# Patient Record
Sex: Female | Born: 1950
Health system: Southern US, Community
[De-identification: ages and names within clinical notes are randomized; demographics above are authoritative.]

## PROBLEM LIST (undated history)

## (undated) DIAGNOSIS — Z860100 Personal history of colon polyps, unspecified: Secondary | ICD-10-CM

## (undated) DIAGNOSIS — IMO0002 Reserved for concepts with insufficient information to code with codable children: Secondary | ICD-10-CM

## (undated) DIAGNOSIS — M810 Age-related osteoporosis without current pathological fracture: Secondary | ICD-10-CM

## (undated) DIAGNOSIS — E039 Hypothyroidism, unspecified: Secondary | ICD-10-CM

## (undated) DIAGNOSIS — E559 Vitamin D deficiency, unspecified: Secondary | ICD-10-CM

## (undated) DIAGNOSIS — F419 Anxiety disorder, unspecified: Secondary | ICD-10-CM

## (undated) DIAGNOSIS — S329XXA Fracture of unspecified parts of lumbosacral spine and pelvis, initial encounter for closed fracture: Secondary | ICD-10-CM

## (undated) DIAGNOSIS — K259 Gastric ulcer, unspecified as acute or chronic, without hemorrhage or perforation: Secondary | ICD-10-CM

## (undated) DIAGNOSIS — F410 Panic disorder [episodic paroxysmal anxiety] without agoraphobia: Secondary | ICD-10-CM

## (undated) DIAGNOSIS — J45909 Unspecified asthma, uncomplicated: Secondary | ICD-10-CM

## (undated) HISTORY — DX: Vitamin D deficiency, unspecified: E55.9

## (undated) HISTORY — DX: Personal history of colonic polyps: Z86.010

## (undated) HISTORY — DX: Reserved for concepts with insufficient information to code with codable children: IMO0002

## (undated) HISTORY — DX: Fracture of unspecified parts of lumbosacral spine and pelvis, initial encounter for closed fracture: S32.9XXA

## (undated) HISTORY — DX: Unspecified asthma, uncomplicated: J45.909

## (undated) HISTORY — PX: BILATERAL SALPINGOOPHORECTOMY: SHX1223

## (undated) HISTORY — DX: Anxiety disorder, unspecified: F41.9

## (undated) HISTORY — DX: Age-related osteoporosis without current pathological fracture: M81.0

## (undated) HISTORY — DX: Panic disorder (episodic paroxysmal anxiety): F41.0

## (undated) HISTORY — PX: TOTAL ABDOMINAL HYSTERECTOMY: SHX209

## (undated) HISTORY — DX: Gastric ulcer, unspecified as acute or chronic, without hemorrhage or perforation: K25.9

## (undated) HISTORY — DX: Personal history of colon polyps, unspecified: Z86.0100

## (undated) HISTORY — PX: TONSILLECTOMY: SUR1361

## (undated) HISTORY — DX: Hypothyroidism, unspecified: E03.9

## (undated) HISTORY — PX: COLONOSCOPY: SHX174

---

## 1998-07-29 ENCOUNTER — Other Ambulatory Visit: Admission: RE | Admit: 1998-07-29 | Discharge: 1998-07-29 | Payer: Self-pay | Admitting: *Deleted

## 1999-09-29 ENCOUNTER — Other Ambulatory Visit: Admission: RE | Admit: 1999-09-29 | Discharge: 1999-09-29 | Payer: Self-pay | Admitting: *Deleted

## 1999-12-05 ENCOUNTER — Encounter: Admission: RE | Admit: 1999-12-05 | Discharge: 1999-12-05 | Payer: Self-pay | Admitting: Orthopaedic Surgery

## 1999-12-05 ENCOUNTER — Encounter: Payer: Self-pay | Admitting: Orthopaedic Surgery

## 2002-12-25 ENCOUNTER — Other Ambulatory Visit: Admission: RE | Admit: 2002-12-25 | Discharge: 2002-12-25 | Payer: Self-pay | Admitting: *Deleted

## 2005-12-20 ENCOUNTER — Other Ambulatory Visit: Admission: RE | Admit: 2005-12-20 | Discharge: 2005-12-20 | Payer: Self-pay | Admitting: Obstetrics and Gynecology

## 2007-12-04 ENCOUNTER — Encounter: Admission: RE | Admit: 2007-12-04 | Discharge: 2007-12-04 | Payer: Self-pay | Admitting: Emergency Medicine

## 2007-12-05 ENCOUNTER — Encounter: Admission: RE | Admit: 2007-12-05 | Discharge: 2007-12-05 | Payer: Self-pay | Admitting: Emergency Medicine

## 2008-01-03 ENCOUNTER — Encounter: Admission: RE | Admit: 2008-01-03 | Discharge: 2008-01-03 | Payer: Self-pay | Admitting: Family Medicine

## 2008-03-30 ENCOUNTER — Ambulatory Visit: Payer: Self-pay | Admitting: Pulmonary Disease

## 2008-03-30 DIAGNOSIS — E559 Vitamin D deficiency, unspecified: Secondary | ICD-10-CM | POA: Insufficient documentation

## 2008-03-30 DIAGNOSIS — R059 Cough, unspecified: Secondary | ICD-10-CM | POA: Insufficient documentation

## 2008-03-30 DIAGNOSIS — E039 Hypothyroidism, unspecified: Secondary | ICD-10-CM | POA: Insufficient documentation

## 2008-03-30 DIAGNOSIS — R05 Cough: Secondary | ICD-10-CM

## 2008-03-30 DIAGNOSIS — J309 Allergic rhinitis, unspecified: Secondary | ICD-10-CM | POA: Insufficient documentation

## 2008-03-30 DIAGNOSIS — F410 Panic disorder [episodic paroxysmal anxiety] without agoraphobia: Secondary | ICD-10-CM | POA: Insufficient documentation

## 2008-03-30 DIAGNOSIS — M810 Age-related osteoporosis without current pathological fracture: Secondary | ICD-10-CM | POA: Insufficient documentation

## 2008-03-31 ENCOUNTER — Ambulatory Visit: Payer: Self-pay | Admitting: Internal Medicine

## 2009-06-23 ENCOUNTER — Other Ambulatory Visit: Admission: RE | Admit: 2009-06-23 | Discharge: 2009-06-23 | Payer: Self-pay | Admitting: Obstetrics and Gynecology

## 2009-09-30 ENCOUNTER — Ambulatory Visit (HOSPITAL_COMMUNITY): Admission: RE | Admit: 2009-09-30 | Discharge: 2009-09-30 | Payer: Self-pay | Admitting: Internal Medicine

## 2010-05-02 ENCOUNTER — Encounter: Payer: Self-pay | Admitting: Family Medicine

## 2013-07-22 ENCOUNTER — Other Ambulatory Visit: Payer: Self-pay | Admitting: Gastroenterology

## 2013-11-22 ENCOUNTER — Encounter: Payer: Self-pay | Admitting: *Deleted

## 2015-09-29 DIAGNOSIS — E039 Hypothyroidism, unspecified: Secondary | ICD-10-CM | POA: Diagnosis not present

## 2015-11-10 DIAGNOSIS — J4520 Mild intermittent asthma, uncomplicated: Secondary | ICD-10-CM | POA: Diagnosis not present

## 2015-11-10 DIAGNOSIS — G4700 Insomnia, unspecified: Secondary | ICD-10-CM | POA: Diagnosis not present

## 2015-11-10 DIAGNOSIS — F419 Anxiety disorder, unspecified: Secondary | ICD-10-CM | POA: Diagnosis not present

## 2015-11-29 DIAGNOSIS — M810 Age-related osteoporosis without current pathological fracture: Secondary | ICD-10-CM | POA: Diagnosis not present

## 2015-12-08 DIAGNOSIS — H5712 Ocular pain, left eye: Secondary | ICD-10-CM | POA: Diagnosis not present

## 2015-12-08 DIAGNOSIS — H25013 Cortical age-related cataract, bilateral: Secondary | ICD-10-CM | POA: Diagnosis not present

## 2015-12-08 DIAGNOSIS — H5203 Hypermetropia, bilateral: Secondary | ICD-10-CM | POA: Diagnosis not present

## 2015-12-08 DIAGNOSIS — H04123 Dry eye syndrome of bilateral lacrimal glands: Secondary | ICD-10-CM | POA: Diagnosis not present

## 2016-01-07 DIAGNOSIS — Z23 Encounter for immunization: Secondary | ICD-10-CM | POA: Diagnosis not present

## 2016-02-10 DIAGNOSIS — H16103 Unspecified superficial keratitis, bilateral: Secondary | ICD-10-CM | POA: Diagnosis not present

## 2016-02-10 DIAGNOSIS — H04123 Dry eye syndrome of bilateral lacrimal glands: Secondary | ICD-10-CM | POA: Diagnosis not present

## 2016-05-03 DIAGNOSIS — F419 Anxiety disorder, unspecified: Secondary | ICD-10-CM | POA: Diagnosis not present

## 2016-05-03 DIAGNOSIS — E559 Vitamin D deficiency, unspecified: Secondary | ICD-10-CM | POA: Diagnosis not present

## 2016-05-03 DIAGNOSIS — M810 Age-related osteoporosis without current pathological fracture: Secondary | ICD-10-CM | POA: Diagnosis not present

## 2016-05-03 DIAGNOSIS — R109 Unspecified abdominal pain: Secondary | ICD-10-CM | POA: Diagnosis not present

## 2016-05-03 DIAGNOSIS — E7800 Pure hypercholesterolemia, unspecified: Secondary | ICD-10-CM | POA: Diagnosis not present

## 2016-05-03 DIAGNOSIS — Z23 Encounter for immunization: Secondary | ICD-10-CM | POA: Diagnosis not present

## 2016-05-03 DIAGNOSIS — Z0000 Encounter for general adult medical examination without abnormal findings: Secondary | ICD-10-CM | POA: Diagnosis not present

## 2016-05-03 DIAGNOSIS — E039 Hypothyroidism, unspecified: Secondary | ICD-10-CM | POA: Diagnosis not present

## 2016-06-13 DIAGNOSIS — M810 Age-related osteoporosis without current pathological fracture: Secondary | ICD-10-CM | POA: Diagnosis not present

## 2016-07-20 DIAGNOSIS — H04123 Dry eye syndrome of bilateral lacrimal glands: Secondary | ICD-10-CM | POA: Diagnosis not present

## 2016-07-20 DIAGNOSIS — H25813 Combined forms of age-related cataract, bilateral: Secondary | ICD-10-CM | POA: Diagnosis not present

## 2016-07-20 DIAGNOSIS — H16102 Unspecified superficial keratitis, left eye: Secondary | ICD-10-CM | POA: Diagnosis not present

## 2016-08-03 DIAGNOSIS — E559 Vitamin D deficiency, unspecified: Secondary | ICD-10-CM | POA: Diagnosis not present

## 2016-09-07 ENCOUNTER — Ambulatory Visit (INDEPENDENT_AMBULATORY_CARE_PROVIDER_SITE_OTHER): Payer: Medicare Other | Admitting: Family

## 2016-09-07 ENCOUNTER — Ambulatory Visit (INDEPENDENT_AMBULATORY_CARE_PROVIDER_SITE_OTHER): Payer: Medicare Other

## 2016-09-07 DIAGNOSIS — M5441 Lumbago with sciatica, right side: Secondary | ICD-10-CM

## 2016-09-07 MED ORDER — PREDNISONE 10 MG PO TABS: ORAL_TABLET | ORAL | 0 refills | Status: DC

## 2016-09-07 NOTE — Progress Notes (Signed)
Office Visit Note   Patient: Danielle Rivas           Date of Birth: 04/25/1950           MRN: [id number] Visit Date: 09/07/2016              Requested by: No referring provider defined for this encounter. PCP: Darcus Austin, MD  No chief complaint on file.     HPI: The patient is a 66 year old woman who presents today complaining of low back pain for the last week. It radiates into her right buttock and down her posterior right thigh. No known injury however states she has been pulling IV for several hours as well as has been picking up heavy crates at the fat. States this feels like sciatic pain she's had in the past. Is using extra strength Tylenol with minimal relief.  Burning, tingling pain down posterior right thigh. No weakness. No loss of bowel or bladder.   Assessment & Plan: Visit Diagnoses:  1. Acute right-sided low back pain with right-sided sciatica     Plan: Follow up in office in 4 more weeks. Consider MRI at that time r/o HNP.  Follow-Up Instructions: Return in about 4 weeks (around 10/05/2016), or if symptoms worsen or fail to improve.   Ortho Exam  Patient is alert, oriented, no adenopathy, well-dressed, normal affect, normal respiratory effort. No spinous process tenderness. Negative straight leg raise. No focal motor weakness.  Imaging: Xr Lumbar Spine 2-3 Views  Result Date: 09/07/2016 Radiographs of the lumbar spine show no acute finding. Does have loss of disc space L1-L2 and L2-L3. No listhesis.    Labs: No results found for: HGBA1C, ESRSEDRATE, CRP, LABURIC, REPTSTATUS, GRAMSTAIN, CULT, LABORGA  Orders:  Orders Placed This Encounter  Procedures  . XR Lumbar Spine 2-3 Views   No orders of the defined types were placed in this encounter.    Procedures: No procedures performed  Clinical Data: No additional findings.  ROS:  All other systems negative, except as noted in the HPI. Review of Systems  Constitutional: Negative for  chills and fever.  Musculoskeletal: Positive for back pain.  Neurological: Positive for numbness. Negative for weakness.    Objective: Vital Signs: There were no vitals taken for this visit.  Specialty Comments:  No specialty comments available.  PMFS History: Patient Active Problem List   Diagnosis Date Noted  . HYPOTHYROIDISM 03/30/2008  . VITAMIN D DEFICIENCY 03/30/2008  . PANIC DISORDER 03/30/2008  . ALLERGIC RHINITIS 03/30/2008  . OSTEOPOROSIS 03/30/2008  . COUGH 03/30/2008   Past Medical History:  Diagnosis Date  . Anxiety   . Gastric ulcer   . Hx of colonic polyps   . Hypothyroid   . Mild asthma   . Osteoporosis   . Panic attack    disorder  . Pelvic fracture   . Vertebral fracture   . Vitamin D deficiency     Family History  Problem Relation Age of Onset  . Stroke Mother   . Pneumonia Father   . Bladder Cancer Father     Past Surgical History:  Procedure Laterality Date  . BILATERAL SALPINGOOPHORECTOMY    . COLONOSCOPY    . TONSILLECTOMY    . TOTAL ABDOMINAL HYSTERECTOMY     Social History   Occupational History  . Not on file.   Social History Main Topics  . Smoking status: Not on file  . Smokeless tobacco: Not on file  . Alcohol use Not on file  .  Drug use: Unknown  . Sexual activity: Not on file

## 2016-09-29 ENCOUNTER — Telehealth (INDEPENDENT_AMBULATORY_CARE_PROVIDER_SITE_OTHER): Payer: Self-pay | Admitting: Orthopedic Surgery

## 2016-09-29 ENCOUNTER — Other Ambulatory Visit (INDEPENDENT_AMBULATORY_CARE_PROVIDER_SITE_OTHER): Payer: Self-pay | Admitting: Orthopedic Surgery

## 2016-09-29 ENCOUNTER — Telehealth (INDEPENDENT_AMBULATORY_CARE_PROVIDER_SITE_OTHER): Payer: Self-pay | Admitting: Family

## 2016-09-29 ENCOUNTER — Other Ambulatory Visit (INDEPENDENT_AMBULATORY_CARE_PROVIDER_SITE_OTHER): Payer: Self-pay

## 2016-09-29 DIAGNOSIS — M5441 Lumbago with sciatica, right side: Secondary | ICD-10-CM

## 2016-09-29 MED ORDER — METHOCARBAMOL 500 MG PO TABS: 500.0000 mg | ORAL_TABLET | Freq: Three times a day (TID) | ORAL | 0 refills | Status: DC

## 2016-09-29 NOTE — Telephone Encounter (Signed)
Pt was in office 09/07/16 LBP with right rad s/s. pred taper did not work. She is asking for something for pain and also to set up MRI to r/o HNP

## 2016-09-29 NOTE — Telephone Encounter (Signed)
Patient called advised she has taken the prednisone and it did not work. Patient advised she need something to help with the pain in her back and hip. Patient asked if she can be set up for an MRI. The number to contact patient is [phone] or [phone] cell

## 2016-09-29 NOTE — Telephone Encounter (Signed)
I called pt to advise that rx has been faxed to pharm and to advise that the order has been placed for the MRI and that she should here back sometime next week to get sch after prior Josem Kaufmann has been obtained from [facility]. Pt voiced understanding and will call with questions.

## 2016-09-29 NOTE — Telephone Encounter (Signed)
Call patient prescription sent for Robaxin to see if this would help with the radicular pain. I agree, please set the patient up for an MRI scan of her lumbar spine to rule out HNP.

## 2016-09-29 NOTE — Telephone Encounter (Signed)
Patient called stated the pharmacy advised her the medication will not be in for 2 weeks. Patient asked for a call back as soon as possible. The number to contact patient is [phone]

## 2016-10-02 NOTE — Telephone Encounter (Signed)
I called pharmacy and they advised that the medication had been on manufacturers back order but had a shipment come in today and that they will fill the rx for robaxin for her and have it ready with in the hour. I called and lm ovm to advise that this has been done and to call with any other questions.

## 2016-10-03 ENCOUNTER — Telehealth (INDEPENDENT_AMBULATORY_CARE_PROVIDER_SITE_OTHER): Payer: Self-pay

## 2016-10-03 ENCOUNTER — Other Ambulatory Visit (INDEPENDENT_AMBULATORY_CARE_PROVIDER_SITE_OTHER): Payer: Self-pay

## 2016-10-03 MED ORDER — TIZANIDINE HCL 2 MG PO CAPS: 2.0000 mg | ORAL_CAPSULE | Freq: Three times a day (TID) | ORAL | 0 refills | Status: DC

## 2016-10-03 NOTE — Telephone Encounter (Signed)
Zanaflex  

## 2016-10-03 NOTE — Telephone Encounter (Signed)
done

## 2016-10-03 NOTE — Telephone Encounter (Signed)
Can we call in a prescription for Flexeril

## 2016-10-03 NOTE — Telephone Encounter (Signed)
Baclofen or zanaflex. Which one. Flexeril is not an option.

## 2016-10-03 NOTE — Telephone Encounter (Signed)
Pt was given rx for robaxin for LBP with sciatica and insurance requires prior auth. Pt has not tried and failed the preferred medications of baclofen or tizandine. Do you want to write rx for either one of these for the pt to try first?

## 2016-10-07 ENCOUNTER — Ambulatory Visit
Admission: RE | Admit: 2016-10-07 | Discharge: 2016-10-07 | Disposition: A | Discharge status: Home / Self Care | Payer: Medicare Other | Source: Ambulatory Visit | Attending: Orthopedic Surgery | Admitting: Orthopedic Surgery

## 2016-10-07 DIAGNOSIS — M5441 Lumbago with sciatica, right side: Secondary | ICD-10-CM

## 2016-10-07 DIAGNOSIS — M5126 Other intervertebral disc displacement, lumbar region: Secondary | ICD-10-CM | POA: Diagnosis not present

## 2016-10-09 ENCOUNTER — Other Ambulatory Visit (INDEPENDENT_AMBULATORY_CARE_PROVIDER_SITE_OTHER): Payer: Self-pay

## 2016-10-09 DIAGNOSIS — G8929 Other chronic pain: Secondary | ICD-10-CM

## 2016-10-09 DIAGNOSIS — M5441 Lumbago with sciatica, right side: Principal | ICD-10-CM

## 2016-10-26 ENCOUNTER — Ambulatory Visit (INDEPENDENT_AMBULATORY_CARE_PROVIDER_SITE_OTHER): Payer: Medicare Other

## 2016-10-26 ENCOUNTER — Ambulatory Visit (INDEPENDENT_AMBULATORY_CARE_PROVIDER_SITE_OTHER): Payer: Medicare Other | Admitting: Physical Medicine and Rehabilitation

## 2016-10-26 ENCOUNTER — Encounter (INDEPENDENT_AMBULATORY_CARE_PROVIDER_SITE_OTHER): Payer: Self-pay | Admitting: Physical Medicine and Rehabilitation

## 2016-10-26 VITALS — BP 118/64 | HR 60

## 2016-10-26 DIAGNOSIS — M5416 Radiculopathy, lumbar region: Secondary | ICD-10-CM

## 2016-10-26 DIAGNOSIS — G8929 Other chronic pain: Secondary | ICD-10-CM

## 2016-10-26 DIAGNOSIS — M7071 Other bursitis of hip, right hip: Secondary | ICD-10-CM | POA: Diagnosis not present

## 2016-10-26 DIAGNOSIS — R202 Paresthesia of skin: Secondary | ICD-10-CM | POA: Diagnosis not present

## 2016-10-26 DIAGNOSIS — M5441 Lumbago with sciatica, right side: Secondary | ICD-10-CM | POA: Diagnosis not present

## 2016-10-26 NOTE — Progress Notes (Deleted)
Lower back pain. Right buttock pain radiating down back of right leg. Sitting and lying down make pain worse. Says it hurts as long as she is touching it. Walking makes it better.

## 2016-10-26 NOTE — Progress Notes (Signed)
Danielle Rivas - 66 y.o. female MRN [id number]  Date of birth: 1950/11/17  Office Visit Note: Visit Date: 10/26/2016 PCP: Darcus Austin, MD Referred by: Darcus Austin, MD  Subjective: Chief Complaint  Patient presents with  . Lower Back - Pain   HPI: Danielle Rivas is a 66 year old female followed by Dr. Sharol Given with history of several months of chronic worsening right buttock pain that radiates down the back of the right leg to the calf. It is mostly in an S1 distribution into the buttock and hamstring area. She denies any left-sided complaints. Her symptoms are worse with sitting and lying down. She also reports that if she touches the area and the posterior buttock and we'll just keep hurting and will actually flared up. She reports actually decreased symptoms with walking. She reports increased symptoms in the morning. She reports having had a history of multiple herniated disc but those would resolve in a matter of weeks and she would do better. She reports most recently a situation where she was pulling weeds and ivy and lifting some boxes and she thinks this probably exacerbated the problem. She has not noted any foot drop or focal weakness. She again has had no left-sided complaints. She denies any numbness tingling or paresthesias. She does feel like it is similar to sciatica that she has felt before. Her case is complicated by anxiety disorder and panic disorder in the past. She has been using Tylenol and Zanaflex without much relief. She has had no prior spinal injections or spine surgery. MRI of the lumbar spine was obtained and this is reviewed below.    Review of Systems  Constitutional: Negative for chills, fever, malaise/fatigue and weight loss.  HENT: Negative for hearing loss and sinus pain.   Eyes: Negative for blurred vision, double vision and photophobia.  Respiratory: Negative for cough and shortness of breath.   Cardiovascular: Negative for chest pain, palpitations and  leg swelling.  Gastrointestinal: Negative for abdominal pain, nausea and vomiting.  Genitourinary: Negative for flank pain.  Musculoskeletal: Positive for back pain. Negative for myalgias.       Right buttock hip and leg pain  Skin: Negative for itching and rash.  Neurological: Negative for tremors, focal weakness and weakness.  Endo/Heme/Allergies: Negative.   Psychiatric/Behavioral: Negative for depression.  All other systems reviewed and are negative.  Otherwise per HPI.  Assessment & Plan: Visit Diagnoses:  1. Lumbar radiculopathy   2. Chronic bilateral low back pain with right-sided sciatica   3. Paresthesia of skin   4. Ischial bursitis of right side     Plan: Findings:  Chronic history of intermittent back pain and sciatica now with couple months history of worsening right buttock and hamstring pain which could be consistent with a radicular pain. Factors that are consistent of the fact that it seems to hurt worse when she sits and is better when she is walking which is pretty consistent with disc herniation or protrusion. She has MRI evidence of lateral foraminal protrusion on the right at L4-5. He would think this would cause more of an L4 radicular pattern which is not as consistent with her pain but can't be ruled out. She has a left foraminal and lateral protrusion also at L3-4. She has no central canal stenosis at otherwise her spine from an imaging standpoint to exit quite good. She has a history of anxiety osteoporosis. The underlying anxiety probably exacerbates the pain level. She also has symptoms consistent with initial bursitis. She  has pain over the issue him when she pushes. She denies tingling or numbness. Lastly on the differential would be something like a piriformis syndrome with irritation of the sciatic nerve. Today from a diagnostic standpoint because she is in such pain we are going to complete a right initial bursitis injection using fluoroscopic guidance. If that  is beneficial and we would look at focused physical therapy for that and hopefully that may be all she needs. If it's not very beneficial we are going to go ahead and schedule her for a transforaminal injection at L4 on the right. As noted in the procedure area she did not get much relief with the initial anesthetic phase of the initial bursa. I spent more than 20  minutes speaking face-to-face with the patient with 50% of the time in counseling.    Meds & Orders: No orders of the defined types were placed in this encounter.   Orders Placed This Encounter  Procedures  . Large Joint Injection/Arthrocentesis  . XR C-ARM NO REPORT    Follow-up: Return for right L4 transforaminal injection.   Procedures: Initial bursa injection fluoroscopic guidance Date/Time: 10/26/2016 9:43 AM Performed by: Magnus Sinning Authorized by: Magnus Sinning   Consent Given by:  Patient Site marked: the procedure site was marked   Timeout: prior to procedure the correct patient, procedure, and site was verified   Indications:  Pain Location:  Hip Hip joint: R ischium. Prep: patient was prepped and draped in usual sterile fashion   Needle Size:  22 G Needle Length:  3.5 inches Approach:  Posterior Ultrasound Guidance: No   Fluoroscopic Guidance: Yes   Arthrogram: No   Medications:  3 mL bupivacaine 0.5 %; 4 mL lidocaine 2 %; 40 mg triamcinolone acetonide 40 MG/ML  There was excellent flow of contrast around the initial bursa. Biplanar imaging was utilized for needle localization. Patient did not get much relief during the anesthetic phase.     No notes on file   Clinical History: MRI LUMBAR SPINE WITHOUT CONTRAST 10/07/2016   TECHNIQUE: Multiplanar, multisequence MR imaging of the lumbar spine was performed. No intravenous contrast was administered.  COMPARISON: Lumbar radiographs 09/07/2016  FINDINGS: Segmentation: Normal as demonstrated on the prior radiographs.  Alignment: Subtle  retrolisthesis at L3-L4 and L4-L5. Mild straightening of lower lumbar lordosis.  Vertebrae: No marrow edema or evidence of acute osseous abnormality. Visualized bone marrow signal is within normal limits. Visible sacrum and SI joints are intact.  Conus medullaris: Extends to the L1-L2 level and appears normal.  Paraspinal and other soft tissues: Visualized abdominal viscera and paraspinal soft tissues are within normal limits.  Disc levels:  T10-T11: Negative.  T11-T12: Negative.  T12-L1: Negative.  L1-L2: Negative.  L2-L3: Minimal far lateral disc bulging. No stenosis.  L3-L4: Subtle disc space loss. Broad-based left foraminal disc protrusion (Series 6, image 11 and series 11, image 22). Associated mild to moderate left foraminal stenosis at the exiting left L3 nerve level. Normal right foramen, and no other stenosis at this level.  L4-L5: Subtle disc space loss. Smaller broad-based right foraminal disc protrusion at this level best seen on series 14, image 28. This does appear to contact the exiting right L4 nerve. Superimposed mild circumferential disc bulging at this level. Mild facet and ligament flavum hypertrophy also. No convincing right lateral recess stenosis. No spinal stenosis.  L5-S1: Mild facet hypertrophy, otherwise negative.  IMPRESSION: 1. Symptomatic level appears to be L4-L5 where there is a small right foraminal disc  protrusion. Query right L4 radiculitis. Mild underlying disc bulging plus posterior element degeneration at this level. 2. Somewhat larger left side foraminal disc protrusion at L3-L4 involves the exiting left L3 nerve. 3. No lumbar spinal stenosis, and very mild for age lumbar spine degeneration elsewhere.  She reports that she quit smoking about 8 years ago. Her smoking use included Cigarettes. She has never used smokeless tobacco. No results for input(s): HGBA1C, LABURIC in the last 8760 hours.  Objective:  VS:  HT:     WT:   BMI:     BP:118/64  HR:60bpm  TEMP: ( )  RESP:  Physical Exam  Constitutional: She is oriented to person, place, and time. She appears well-developed and well-nourished.  Eyes: Pupils are equal, round, and reactive to light. Conjunctivae and EOM are normal.  Cardiovascular: Normal rate and intact distal pulses.   Pulmonary/Chest: Effort normal.  Musculoskeletal:  Patient ambulates without aid with a slightly antalgic gait to the right. She sits very uncomfortably and has to move quite a bit while seated. She has an equivocally positive slump test on the right with hamstring tightness. It is not a family positive tests. She has no pain over the greater trochanters. She does have pain over the right issue. She has good distal strength without clonus. Pain with extension of the lumbar spine.  Neurological: She is alert and oriented to person, place, and time. She exhibits normal muscle tone.  Skin: Skin is warm and dry. No rash noted. No erythema.  Psychiatric: She has a normal mood and affect. Her behavior is normal.  Nursing note and vitals reviewed.   Ortho Exam Imaging: Xr C-arm No Report  Result Date: 10/26/2016 Please see Notes or Procedures tab for imaging impression.   Past Medical/Family/Surgical/Social History: Medications & Allergies reviewed per EMR Patient Active Problem List   Diagnosis Date Noted  . HYPOTHYROIDISM 03/30/2008  . VITAMIN D DEFICIENCY 03/30/2008  . PANIC DISORDER 03/30/2008  . ALLERGIC RHINITIS 03/30/2008  . OSTEOPOROSIS 03/30/2008  . COUGH 03/30/2008   Past Medical History:  Diagnosis Date  . Anxiety   . Gastric ulcer   . Hx of colonic polyps   . Hypothyroid   . Mild asthma   . Osteoporosis   . Panic attack    disorder  . Pelvic fracture ([place])   . Vertebral fracture   . Vitamin D deficiency    Family History  Problem Relation Age of Onset  . Stroke Mother   . Pneumonia Father   . Bladder Cancer Father    Past Surgical History:    Procedure Laterality Date  . BILATERAL SALPINGOOPHORECTOMY    . COLONOSCOPY    . TONSILLECTOMY    . TOTAL ABDOMINAL HYSTERECTOMY     Social History   Occupational History  . Not on file.   Social History Main Topics  . Smoking status: Former Smoker    Types: Cigarettes    Quit date: 2010  . Smokeless tobacco: Never Used  . Alcohol use Not on file  . Drug use: Unknown  . Sexual activity: Not on file

## 2016-10-27 ENCOUNTER — Encounter (INDEPENDENT_AMBULATORY_CARE_PROVIDER_SITE_OTHER): Payer: Self-pay | Admitting: Physical Medicine and Rehabilitation

## 2016-10-27 MED ORDER — LIDOCAINE HCL 2 % IJ SOLN
4.0000 mL | INTRAMUSCULAR | Status: AC | PRN
  Administered 2016-10-26: 4 mL

## 2016-10-27 MED ORDER — TRIAMCINOLONE ACETONIDE 40 MG/ML IJ SUSP
40.0000 mg | INTRAMUSCULAR | Status: AC | PRN
  Administered 2016-10-26: 40 mg via INTRA_ARTICULAR

## 2016-10-27 MED ORDER — BUPIVACAINE HCL 0.5 % IJ SOLN
3.0000 mL | INTRAMUSCULAR | Status: AC | PRN
  Administered 2016-10-26: 3 mL via INTRA_ARTICULAR

## 2016-11-08 ENCOUNTER — Ambulatory Visit (INDEPENDENT_AMBULATORY_CARE_PROVIDER_SITE_OTHER): Payer: Medicare Other | Admitting: Physical Medicine and Rehabilitation

## 2016-11-08 ENCOUNTER — Encounter (INDEPENDENT_AMBULATORY_CARE_PROVIDER_SITE_OTHER): Payer: Self-pay | Admitting: Physical Medicine and Rehabilitation

## 2016-11-08 ENCOUNTER — Ambulatory Visit (INDEPENDENT_AMBULATORY_CARE_PROVIDER_SITE_OTHER): Payer: Self-pay

## 2016-11-08 VITALS — BP 125/64 | HR 67

## 2016-11-08 DIAGNOSIS — M5416 Radiculopathy, lumbar region: Secondary | ICD-10-CM | POA: Diagnosis not present

## 2016-11-08 DIAGNOSIS — G4700 Insomnia, unspecified: Secondary | ICD-10-CM | POA: Diagnosis not present

## 2016-11-08 DIAGNOSIS — F419 Anxiety disorder, unspecified: Secondary | ICD-10-CM | POA: Diagnosis not present

## 2016-11-08 DIAGNOSIS — M5431 Sciatica, right side: Secondary | ICD-10-CM | POA: Diagnosis not present

## 2016-11-08 DIAGNOSIS — M5116 Intervertebral disc disorders with radiculopathy, lumbar region: Secondary | ICD-10-CM

## 2016-11-08 MED ORDER — HYDROCODONE-ACETAMINOPHEN 5-325 MG PO TABS: ORAL_TABLET | ORAL | 0 refills | Status: DC

## 2016-11-08 MED ORDER — LIDOCAINE HCL (PF) 1 % IJ SOLN
2.0000 mL | Freq: Once | INTRAMUSCULAR | Status: AC
  Administered 2016-11-08: 2 mL

## 2016-11-08 MED ORDER — METHYLPREDNISOLONE ACETATE 80 MG/ML IJ SUSP
80.0000 mg | Freq: Once | INTRAMUSCULAR | Status: AC
  Administered 2016-11-08: 80 mg

## 2016-11-08 NOTE — Progress Notes (Unsigned)
Fluoro Time: 19 sec MGy: 9.66

## 2016-11-08 NOTE — Progress Notes (Signed)
Patient is here today for planned right L4 transforaminal injection. No change in symptoms.

## 2016-11-08 NOTE — Patient Instructions (Signed)

## 2016-11-11 NOTE — Procedures (Signed)
Danielle Rivas is a 66 year old female that I recently saw for evaluation through Dr. Sharol Given. She's having right low back and buttock pain and hamstring pain with some pain in the leg. As we discussed in our last note is more of an S1 radicular-type pain pattern. We did complete diagnostic initial bursa injection which did not help. MRI lumbar spine does not show anything really fitting of that distribution but she does have a lateral foraminal disc herniation on the right at L4. We are going to complete a diagnostic and hopefully therapeutic right L4 transforaminal injection. The patient states emphatically today that nobody is giving her any pain medication. A long discussion with her about opioid medications and the risk and benefits and was to be expected from those. I did give her a 7 day prescription of twice a day hydrocodone until we see if the injection helps. This is not a condition which would be something that chronic opioids would be appropriate for. If she doesn't get much relief diagnostically for follow-up with Dr. Sharol Given to look at this more from a orthopedic standpoint of her pelvic and buttock and hamstring pain. She might benefit from regrouping with a physical therapist. She does have an underlying anxiety disorder.  Lumbosacral Transforaminal Epidural Steroid Injection - Infraneural Approach with Fluoroscopic Guidance  Patient: Danielle Rivas      Date of Birth: 10-Sep-1950 MRN: [id number] PCP: Darcus Austin, MD      Visit Date: 11/08/2016   Universal Protocol:     Consent Given By: the patient  Position: PRONE   Additional Comments: Vital signs were monitored before and after the procedure. Patient was prepped and draped in the usual sterile fashion. The correct patient, procedure, and site was verified.   Injection Procedure Details:  Procedure Site One Meds Administered:  Meds ordered this encounter  Medications  . lidocaine (PF) (XYLOCAINE) 1 % injection 2 mL  .  methylPREDNISolone acetate (DEPO-MEDROL) injection 80 mg  . HYDROcodone-acetaminophen (NORCO/VICODIN) 5-325 MG tablet    Sig: Take 1 tablet by mouth every 12 hours when necessary for severe pain    Dispense:  14 tablet    Refill:  0      Laterality: Right  Location/Site:  L4-L5  Needle size: 22 G  Needle type: Spinal  Needle Placement: Transforaminal  Findings:  -Contrast Used: 1 mL iohexol 180 mg iodine/mL   -Comments: Excellent flow of contrast along the nerve and into the epidural space.  Procedure Details: After squaring off the end-plates of the desired vertebral level to get a true AP view, the C-arm was obliqued to the painful side so that the superior articulating process is positioned about 1/3 the length of the inferior endplate.  The needle was aimed toward the junction of the superior articular process and the transverse process of the inferior vertebrae. The needle's initial entry is in the lower third of the foramen through Kambin's triangle. The soft tissues overlying this target were infiltrated with 2-3 ml. of 1% Lidocaine without Epinephrine.  The spinal needle was then inserted and advanced toward the target using a "trajectory" view along the fluoroscope beam.  Under AP and lateral visualization, the needle was advanced so it did not puncture dura and did not traverse medially beyond the 6 o'clock position of the pedicle. Bi-planar projections were used to confirm position. Aspiration was confirmed to be negative for CSF and/or blood. A 1-2 ml. volume of Isovue-250 was injected and flow of contrast was noted at each  level. Radiographs were obtained for documentation purposes.   After attaining the desired flow of contrast documented above, a 0.5 to 1.0 ml test dose of 0.25% Marcaine was injected into each respective transforaminal space.  The patient was observed for 90 seconds post injection.  After no sensory deficits were reported, and normal lower extremity motor  function was noted,   the above injectate was administered so that equal amounts of the injectate were placed at each foramen (level) into the transforaminal epidural space.   Additional Comments:  The patient tolerated the procedure well Dressing: Band-Aid    Post-procedure details: Patient was observed during the procedure. Post-procedure instructions were reviewed.  Patient left the clinic in stable condition.

## 2016-11-28 DIAGNOSIS — Z1231 Encounter for screening mammogram for malignant neoplasm of breast: Secondary | ICD-10-CM | POA: Diagnosis not present

## 2016-12-27 DIAGNOSIS — M810 Age-related osteoporosis without current pathological fracture: Secondary | ICD-10-CM | POA: Diagnosis not present

## 2016-12-31 DIAGNOSIS — Z23 Encounter for immunization: Secondary | ICD-10-CM | POA: Diagnosis not present

## 2017-02-25 DIAGNOSIS — J069 Acute upper respiratory infection, unspecified: Secondary | ICD-10-CM | POA: Diagnosis not present

## 2017-02-25 DIAGNOSIS — J209 Acute bronchitis, unspecified: Secondary | ICD-10-CM | POA: Diagnosis not present

## 2017-02-27 ENCOUNTER — Emergency Department (HOSPITAL_COMMUNITY)
Admission: EM | Admit: 2017-02-27 | Discharge: 2017-02-27 | Disposition: A | Discharge status: Home / Self Care | Payer: Medicare Other | Attending: Emergency Medicine | Admitting: Emergency Medicine

## 2017-02-27 ENCOUNTER — Emergency Department (HOSPITAL_COMMUNITY): Payer: Medicare Other

## 2017-02-27 ENCOUNTER — Encounter (HOSPITAL_COMMUNITY): Payer: Self-pay | Admitting: Emergency Medicine

## 2017-02-27 DIAGNOSIS — E039 Hypothyroidism, unspecified: Secondary | ICD-10-CM | POA: Insufficient documentation

## 2017-02-27 DIAGNOSIS — Z79899 Other long term (current) drug therapy: Secondary | ICD-10-CM | POA: Diagnosis not present

## 2017-02-27 DIAGNOSIS — R079 Chest pain, unspecified: Secondary | ICD-10-CM | POA: Diagnosis not present

## 2017-02-27 DIAGNOSIS — K219 Gastro-esophageal reflux disease without esophagitis: Secondary | ICD-10-CM | POA: Diagnosis not present

## 2017-02-27 DIAGNOSIS — R05 Cough: Secondary | ICD-10-CM | POA: Diagnosis not present

## 2017-02-27 DIAGNOSIS — Z87891 Personal history of nicotine dependence: Secondary | ICD-10-CM | POA: Diagnosis not present

## 2017-02-27 DIAGNOSIS — J45909 Unspecified asthma, uncomplicated: Secondary | ICD-10-CM | POA: Insufficient documentation

## 2017-02-27 LAB — BASIC METABOLIC PANEL
Anion gap: 9 (ref 5–15)
BUN: 22 mg/dL — AB (ref 6–20)
CALCIUM: 10.1 mg/dL (ref 8.9–10.3)
CO2: 27 mmol/L (ref 22–32)
CREATININE: 0.66 mg/dL (ref 0.44–1.00)
Chloride: 101 mmol/L (ref 101–111)
GFR calc non Af Amer: >60 mL/min (ref >60)
Glucose, Bld: 79 mg/dL (ref 65–99)
Potassium: 4.1 mmol/L (ref 3.5–5.1)
SODIUM: 137 mmol/L (ref 135–145)

## 2017-02-27 LAB — CBC
HCT: 44.3 % (ref 36.0–46.0)
Hemoglobin: 14.9 g/dL (ref 12.0–15.0)
MCH: 31.6 pg (ref 26.0–34.0)
MCHC: 33.6 g/dL (ref 30.0–36.0)
MCV: 94.1 fL (ref 78.0–100.0)
PLATELETS: 281 10*3/uL (ref 150–400)
RBC: 4.71 MIL/uL (ref 3.87–5.11)
RDW: 12.5 % (ref 11.5–15.5)
WBC: 18.5 10*3/uL — AB (ref 4.0–10.5)

## 2017-02-27 LAB — I-STAT TROPONIN, ED
TROPONIN I, POC: 0 ng/mL (ref 0.00–0.08)
Troponin i, poc: 0 ng/mL (ref 0.00–0.08)

## 2017-02-27 MED ORDER — OMEPRAZOLE 20 MG PO CPDR: 20.0000 mg | DELAYED_RELEASE_CAPSULE | Freq: Every day | ORAL | 0 refills | Status: DC

## 2017-02-27 NOTE — Discharge Instructions (Signed)
Return to the ED with any concerns including difficulty breathing, chest pain, vomiting and not able to keep down liquids, blood in vomit, or any other alarming symptoms

## 2017-02-27 NOTE — ED Triage Notes (Signed)
Pt to ER for generalized chest discomfort, states crushing in nature, awakened patient at 3 am along with belching. States recently diagnosed with bronchitis on Sunday, states on prednisone and antibiotics. States has had pain like this with acid reflux before. Pt appears anxious in triage. A/o x4.

## 2017-02-27 NOTE — ED Provider Notes (Signed)
[facility] [place] [facility] EMERGENCY DEPARTMENT Provider Note   CSN: [id number] Arrival date & time: 02/27/17  1638     History   Chief Complaint Chief Complaint  Patient presents with  . Chest Pain    HPI Danielle Rivas is a 66 y.o. female.  HPI  Patient presenting with complaint of midsternal chest pain that woke her out of sleep last night.  She states the pain is similar to prior episodes of reflux.  She was started on prednisone recently for a respiratory infection and states that prednisone has caused bad reflux for her in the past.  She has been having some milder similar symptoms for the past 4 days.  She has tried Tums and Mylanta without much relief.  She states the pain has resolved at the time of my evaluation.  She had no shortness of breath no nausea no diaphoresis associated.  There are no other associated systemic symptoms, there are no other alleviating or modifying factors.   Past Medical History:  Diagnosis Date  . Anxiety   . Gastric ulcer   . Hx of colonic polyps   . Hypothyroid   . Mild asthma   . Osteoporosis   . Panic attack    disorder  . Pelvic fracture ([place])   . Vertebral fracture   . Vitamin D deficiency     Patient Active Problem List   Diagnosis Date Noted  . HYPOTHYROIDISM 03/30/2008  . VITAMIN D DEFICIENCY 03/30/2008  . PANIC DISORDER 03/30/2008  . ALLERGIC RHINITIS 03/30/2008  . OSTEOPOROSIS 03/30/2008  . COUGH 03/30/2008    Past Surgical History:  Procedure Laterality Date  . BILATERAL SALPINGOOPHORECTOMY    . COLONOSCOPY    . TONSILLECTOMY    . TOTAL ABDOMINAL HYSTERECTOMY      OB History    No data available       Home Medications    Prior to Admission medications   Medication Sig Start Date End Date Taking? Authorizing Provider  albuterol (PROVENTIL HFA;VENTOLIN HFA) 108 (90 BASE) MCG/ACT inhaler Inhale 2 puffs into the lungs every 6 (six) hours as needed for wheezing or shortness of breath.   Yes [provider]  benzonatate (TESSALON) 200 MG capsule Take 200 mg by mouth 3 (three) times daily as needed for cough. 02/25/17  Yes [provider]  Calcium 1500 MG tablet Take 1,500 mg by mouth daily.   Yes [provider]  cefdinir (OMNICEF) 300 MG capsule Take 300 mg by mouth daily. 02/25/17  Yes [provider]  Cholecalciferol (VITAMIN D3) 2000 UNITS capsule Take 2,000 Units by mouth daily.   Yes [provider]  clonazePAM (KLONOPIN) 0.5 MG tablet Take 0.5 mg by mouth at bedtime.   Yes [provider]  denosumab (PROLIA) 60 MG/ML SOLN injection Inject 60 mg into the skin every 6 (six) months. Administer in upper arm, thigh, or abdomen   Yes [provider]  fluticasone (FLONASE) 50 MCG/ACT nasal spray Place 2 sprays into both nostrils daily.   Yes [provider]  Fluticasone-Salmeterol (ADVAIR) 250-50 MCG/DOSE AEPB Inhale 1 puff into the lungs 2 (two) times daily.   Yes [provider]  HYDROcodone-homatropine (HYCODAN) 5-1.5 MG/5ML syrup Take 5 mLs by mouth 4 (four) times daily as needed for cough. 02/25/17  Yes [provider]  ibuprofen (ADVIL,MOTRIN) 600 MG tablet Take 1,200 mg by mouth every 6 (six) hours as needed for moderate pain.    Yes [provider]  ipratropium (ATROVENT) 0.06 % nasal spray Place 2 sprays into both nostrils 4 (four) times daily. 02/25/17  Yes [provider]  ipratropium-albuterol (DUONEB) 0.5-2.5 (3) MG/3ML SOLN Take 3 mLs by nebulization every 4 (four) hours as needed (bronchitis).    Yes [provider]  levothyroxine (SYNTHROID, LEVOTHROID) 112 MCG tablet Take 112 mcg by mouth daily. 02/22/17  Yes [provider]  traZODone (DESYREL) 100 MG tablet Take 100 mg by mouth at bedtime.   Yes [provider]  HYDROcodone-acetaminophen (NORCO/VICODIN) 5-325 MG tablet Take 1 tablet by mouth every 12 hours when necessary for severe pain Patient  not taking: Reported on 02/27/2017 11/08/16   Magnus Sinning, MD  methocarbamol (ROBAXIN) 500 MG tablet Take 1 tablet (500 mg total) by mouth 3 (three) times daily. Patient not taking: Reported on 02/27/2017 09/29/16   Newt Minion, MD  omeprazole (PRILOSEC) 20 MG capsule Take 1 capsule (20 mg total) by mouth daily. 02/27/17   Mabe, Forbes Cellar, MD  predniSONE (DELTASONE) 10 MG tablet 6 tablets for 2 days, then 5 for 2, then 4 for 2, then 3  for 2, then 2 for 2, then 1 tablet for 2 Patient not taking: Reported on 02/27/2017 09/07/16   Suzan Slick, NP  tizanidine (ZANAFLEX) 2 MG capsule Take 1 capsule (2 mg total) by mouth 3 (three) times daily. Patient not taking: Reported on 02/27/2017 10/03/16   Newt Minion, MD    Family History Family History  Problem Relation Age of Onset  . Stroke Mother   . Pneumonia Father   . Bladder Cancer Father     Social History Social History   Tobacco Use  . Smoking status: Former Smoker    Types: Cigarettes    Last attempt to quit: 2010    Years since quitting: 8.8  . Smokeless tobacco: Never Used  Substance Use Topics  . Alcohol use: Not on file  . Drug use: Not on file     Allergies   Doxycycline; Fosamax [alendronate sodium]; and Levaquin [levofloxacin in d5w]   Review of Systems Review of Systems  ROS reviewed and all otherwise negative except for mentioned in HPI   Physical Exam Updated Vital Signs BP 123/67   Pulse 76   Temp (!) 97.3 F (36.3 C) (Oral)   Resp (!) 33   SpO2 99%  Vitals reviewed Physical Exam  Physical Examination: General appearance - alert, well appearing, and in no distress Mental status - alert, oriented to person, place, and time Eyes - no conjunctival injection, no scleral icterus Chest - clear to auscultation, no wheezes, rales or rhonchi, symmetric air entry Heart - normal rate, regular rhythm, normal S1, S2, no murmurs, rubs, clicks or gallops Abdomen - soft, nontender, nondistended, no masses or  organomegaly Neurological - alert, oriented, normal speech Extremities - peripheral pulses normal, no pedal edema, no clubbing or cyanosis Skin - normal coloration and turgor, no rashes   ED Treatments / Results  Labs (all labs ordered are listed, but only abnormal results are displayed) Labs Reviewed  BASIC METABOLIC PANEL - Abnormal; Notable for the following components:      Result Value   BUN 22 (*)    All other components within normal limits  CBC - Abnormal; Notable for the following components:   WBC 18.5 (*)    All other components within normal limits  I-STAT TROPONIN, ED  I-STAT TROPONIN, ED    EKG  EKG Interpretation  Date/Time:  Tuesday  February 27 2017 08:45:11 EST Ventricular Rate:  69 PR Interval:  126 QRS Duration: 88 QT Interval:  374 QTC Calculation: 400 R Axis:   72 Text Interpretation:  Normal sinus rhythm Normal ECG No old tracing to compare Confirmed by Alfonzo Beers [phone]) on 02/27/2017 2:13:29 PM       Radiology Dg Chest 2 View  Result Date: 02/27/2017 CLINICAL DATA:  Two weeks of cough and bronchitis symptoms with onset of sharp chest pain and dizziness this morning. History of asthma, former smoker. EXAM: CHEST  2 VIEW COMPARISON:  CT scan of the chest of December 05, 2007 and chest x-ray of December 04, 2007. FINDINGS: The lungs remain hyperinflated with hemidiaphragm flattening. There is no focal infiltrate. There is no pleural effusion or pneumothorax. The heart and pulmonary vascularity are normal. The mediastinum is normal in width. There is calcification in the wall of the aortic arch. The bony thorax exhibits no acute abnormality. IMPRESSION: Asthma-COPD. No pneumonia, CHF, nor other acute cardiopulmonary abnormality. Thoracic aortic atherosclerosis. Electronically Signed   By: David  Martinique M.D.   On: 02/27/2017 09:37    Procedures Procedures (including critical care time)  Medications Ordered in ED Medications - No data to  display   Initial Impression / Assessment and Plan / ED Course  I have reviewed the triage vital signs and the nursing notes.  Pertinent labs & imaging results that were available during my care of the patient were reviewed by me and considered in my medical decision making (see chart for details).     Patient presenting with chest pain.  She describes her symptoms as a burning in her chest.  She has recently been on prednisone.  She states that prednisone in the past has called reflux to increase.  Her cardiac workup is reassuring.  She has a heart score of 1 with 2 sets of troponins that are negative.  Her symptoms have resolved while in the ED.  She does have an elevated white count which is most likely related to her prednisone for recent respiratory illness.  Patient discharged with prescription for Prilosec.Discharged with strict return precautions.  Pt agreeable with plan.  Final Clinical Impressions(s) / ED Diagnoses   Final diagnoses:  Gastroesophageal reflux disease, esophagitis presence not specified    ED Discharge Orders        Ordered    omeprazole (PRILOSEC) 20 MG capsule  Daily     02/27/17 1524       Pixie Casino, MD 02/27/17 [phone]

## 2017-03-06 DIAGNOSIS — J4521 Mild intermittent asthma with (acute) exacerbation: Secondary | ICD-10-CM | POA: Diagnosis not present

## 2017-05-16 DIAGNOSIS — M810 Age-related osteoporosis without current pathological fracture: Secondary | ICD-10-CM | POA: Diagnosis not present

## 2017-05-16 DIAGNOSIS — G4700 Insomnia, unspecified: Secondary | ICD-10-CM | POA: Diagnosis not present

## 2017-05-16 DIAGNOSIS — Z23 Encounter for immunization: Secondary | ICD-10-CM | POA: Diagnosis not present

## 2017-05-16 DIAGNOSIS — F419 Anxiety disorder, unspecified: Secondary | ICD-10-CM | POA: Diagnosis not present

## 2017-05-16 DIAGNOSIS — E559 Vitamin D deficiency, unspecified: Secondary | ICD-10-CM | POA: Diagnosis not present

## 2017-05-16 DIAGNOSIS — Z0000 Encounter for general adult medical examination without abnormal findings: Secondary | ICD-10-CM | POA: Diagnosis not present

## 2017-05-16 DIAGNOSIS — E7800 Pure hypercholesterolemia, unspecified: Secondary | ICD-10-CM | POA: Diagnosis not present

## 2017-05-16 DIAGNOSIS — Z79899 Other long term (current) drug therapy: Secondary | ICD-10-CM | POA: Diagnosis not present

## 2017-05-16 DIAGNOSIS — M7711 Lateral epicondylitis, right elbow: Secondary | ICD-10-CM | POA: Diagnosis not present

## 2017-05-16 DIAGNOSIS — E039 Hypothyroidism, unspecified: Secondary | ICD-10-CM | POA: Diagnosis not present

## 2017-05-16 DIAGNOSIS — Z1159 Encounter for screening for other viral diseases: Secondary | ICD-10-CM | POA: Diagnosis not present

## 2017-06-27 DIAGNOSIS — M810 Age-related osteoporosis without current pathological fracture: Secondary | ICD-10-CM | POA: Diagnosis not present

## 2017-07-11 DIAGNOSIS — M810 Age-related osteoporosis without current pathological fracture: Secondary | ICD-10-CM | POA: Diagnosis not present

## 2017-07-11 DIAGNOSIS — M8588 Other specified disorders of bone density and structure, other site: Secondary | ICD-10-CM | POA: Diagnosis not present

## 2017-07-12 DIAGNOSIS — H04123 Dry eye syndrome of bilateral lacrimal glands: Secondary | ICD-10-CM | POA: Diagnosis not present

## 2017-07-12 DIAGNOSIS — H16103 Unspecified superficial keratitis, bilateral: Secondary | ICD-10-CM | POA: Diagnosis not present

## 2017-07-12 DIAGNOSIS — H5203 Hypermetropia, bilateral: Secondary | ICD-10-CM | POA: Diagnosis not present

## 2017-07-12 DIAGNOSIS — H25813 Combined forms of age-related cataract, bilateral: Secondary | ICD-10-CM | POA: Diagnosis not present

## 2017-08-22 DIAGNOSIS — E559 Vitamin D deficiency, unspecified: Secondary | ICD-10-CM | POA: Diagnosis not present

## 2017-08-22 DIAGNOSIS — E039 Hypothyroidism, unspecified: Secondary | ICD-10-CM | POA: Diagnosis not present

## 2017-08-22 DIAGNOSIS — M810 Age-related osteoporosis without current pathological fracture: Secondary | ICD-10-CM | POA: Diagnosis not present

## 2017-11-21 DIAGNOSIS — R109 Unspecified abdominal pain: Secondary | ICD-10-CM | POA: Diagnosis not present

## 2017-11-21 DIAGNOSIS — J4520 Mild intermittent asthma, uncomplicated: Secondary | ICD-10-CM | POA: Diagnosis not present

## 2017-11-21 DIAGNOSIS — E559 Vitamin D deficiency, unspecified: Secondary | ICD-10-CM | POA: Diagnosis not present

## 2017-11-21 DIAGNOSIS — G4700 Insomnia, unspecified: Secondary | ICD-10-CM | POA: Diagnosis not present

## 2017-11-21 DIAGNOSIS — F419 Anxiety disorder, unspecified: Secondary | ICD-10-CM | POA: Diagnosis not present

## 2017-11-21 DIAGNOSIS — E039 Hypothyroidism, unspecified: Secondary | ICD-10-CM | POA: Diagnosis not present

## 2017-11-21 DIAGNOSIS — E7800 Pure hypercholesterolemia, unspecified: Secondary | ICD-10-CM | POA: Diagnosis not present

## 2017-11-29 DIAGNOSIS — Z1231 Encounter for screening mammogram for malignant neoplasm of breast: Secondary | ICD-10-CM | POA: Diagnosis not present

## 2018-01-01 DIAGNOSIS — M810 Age-related osteoporosis without current pathological fracture: Secondary | ICD-10-CM | POA: Diagnosis not present

## 2018-01-10 DIAGNOSIS — Z23 Encounter for immunization: Secondary | ICD-10-CM | POA: Diagnosis not present

## 2018-06-04 DIAGNOSIS — G4700 Insomnia, unspecified: Secondary | ICD-10-CM | POA: Diagnosis not present

## 2018-06-04 DIAGNOSIS — F419 Anxiety disorder, unspecified: Secondary | ICD-10-CM | POA: Diagnosis not present

## 2018-06-04 DIAGNOSIS — Z0001 Encounter for general adult medical examination with abnormal findings: Secondary | ICD-10-CM | POA: Diagnosis not present

## 2018-06-04 DIAGNOSIS — Z79899 Other long term (current) drug therapy: Secondary | ICD-10-CM | POA: Diagnosis not present

## 2018-06-04 DIAGNOSIS — E7800 Pure hypercholesterolemia, unspecified: Secondary | ICD-10-CM | POA: Diagnosis not present

## 2018-06-04 DIAGNOSIS — J4520 Mild intermittent asthma, uncomplicated: Secondary | ICD-10-CM | POA: Diagnosis not present

## 2018-06-04 DIAGNOSIS — J301 Allergic rhinitis due to pollen: Secondary | ICD-10-CM | POA: Diagnosis not present

## 2018-06-04 DIAGNOSIS — E039 Hypothyroidism, unspecified: Secondary | ICD-10-CM | POA: Diagnosis not present

## 2018-06-04 DIAGNOSIS — E559 Vitamin D deficiency, unspecified: Secondary | ICD-10-CM | POA: Diagnosis not present

## 2018-08-27 DIAGNOSIS — E559 Vitamin D deficiency, unspecified: Secondary | ICD-10-CM | POA: Diagnosis not present

## 2018-08-27 DIAGNOSIS — E039 Hypothyroidism, unspecified: Secondary | ICD-10-CM | POA: Diagnosis not present

## 2018-08-27 DIAGNOSIS — M810 Age-related osteoporosis without current pathological fracture: Secondary | ICD-10-CM | POA: Diagnosis not present

## 2018-09-10 DIAGNOSIS — M810 Age-related osteoporosis without current pathological fracture: Secondary | ICD-10-CM | POA: Diagnosis not present

## 2018-11-29 ENCOUNTER — Ambulatory Visit (INDEPENDENT_AMBULATORY_CARE_PROVIDER_SITE_OTHER): Payer: Medicare Other | Admitting: Family Medicine

## 2018-11-29 ENCOUNTER — Encounter: Payer: Self-pay | Admitting: Family Medicine

## 2018-11-29 DIAGNOSIS — M545 Low back pain, unspecified: Secondary | ICD-10-CM

## 2018-11-29 MED ORDER — BACLOFEN 10 MG PO TABS: 5.0000 mg | ORAL_TABLET | Freq: Three times a day (TID) | ORAL | 1 refills | Status: DC | PRN

## 2018-11-29 MED ORDER — NABUMETONE 750 MG PO TABS
750.0000 mg | ORAL_TABLET | Freq: Two times a day (BID) | ORAL | 6 refills | Status: DC | PRN
Start: 2018-11-29 — End: 2024-01-21

## 2018-11-29 MED ORDER — HYDROCODONE-ACETAMINOPHEN 5-325 MG PO TABS: 1.0000 | ORAL_TABLET | Freq: Four times a day (QID) | ORAL | 0 refills | Status: DC | PRN

## 2018-11-29 NOTE — Progress Notes (Signed)
Office Visit Note   Patient: Danielle Rivas           Date of Birth: Sep 25, 1950           MRN: [id number] Visit Date: 11/29/2018 Requested by: No referring provider defined for this encounter. PCP: Darcus Austin, MD (Inactive)  Subjective: Chief Complaint  Patient presents with  . Lower Back - Pain    Sharp/stabbing pain in the left lower back/buttock area  and across the back since pruning bushes and spreading pine needles 11/26/18. Progressively worsening. Does not radiate down leg. No numbness/tingling.    HPI: She is here with left low back and posterior hip pain.  Symptoms started a few days ago, shortly after doing some yard work.  She remembers carrying a bale of pine needles in her right hand and doing some other twisting motions.  She has had severe pain in the left posterior hip radiating into her side and toward the front.  She cannot stand upright, cannot put all of her weight on her left leg.  She has noticed some urinary leakage, no numbness.  Pain does not radiate down the leg.  Typically when she has back problems it goes down the right leg, this is unusual for her.  Her last flareup of back pain was 2 years ago at which time she had an MRI showing a right-sided L4-5 disc protrusion and a left-sided L3-4 protrusion contacting the left L3 nerve.  She was asymptomatic on the left side at that time.                ROS: No fevers or chills.  All other systems were reviewed and are negative.  Objective: Vital Signs: There were no vitals taken for this visit.  Physical Exam:  General:  Alert and oriented, in no acute distress. Pulm:  Breathing unlabored. Psy:  Normal mood, congruent affect. Skin: No rash seen. Low back: No tenderness in the midline of the lumbar spine, no pain over the SI joints.  Very tender in the gluteus medius on the left, no pain with internal or external hip rotation.  Straight leg raise negative, lower extremity strength and reflexes are still  normal.  Imaging: None today.  Assessment & Plan: 1.  Left posterior hip pain, suspect due to the L3-4 protrusion seen on MRI scan in 2018. -Medicines given for pain, home exercises including back extension.  If not improving quickly, then either physical therapy or referral for an L3-4 epidural injection. -She will go to the ER immediately if she develops cauda equina symptoms.     Procedures: No procedures performed  No notes on file     PMFS History: Patient Active Problem List   Diagnosis Date Noted  . HYPOTHYROIDISM 03/30/2008  . VITAMIN D DEFICIENCY 03/30/2008  . PANIC DISORDER 03/30/2008  . ALLERGIC RHINITIS 03/30/2008  . OSTEOPOROSIS 03/30/2008  . COUGH 03/30/2008   Past Medical History:  Diagnosis Date  . Anxiety   . Gastric ulcer   . Hx of colonic polyps   . Hypothyroid   . Mild asthma   . Osteoporosis   . Panic attack    disorder  . Pelvic fracture ([place])   . Vertebral fracture   . Vitamin D deficiency     Family History  Problem Relation Age of Onset  . Stroke Mother   . Pneumonia Father   . Bladder Cancer Father     Past Surgical History:  Procedure Laterality Date  .  BILATERAL SALPINGOOPHORECTOMY    . COLONOSCOPY    . TONSILLECTOMY    . TOTAL ABDOMINAL HYSTERECTOMY     Social History   Occupational History  . Not on file  Tobacco Use  . Smoking status: Former Smoker    Types: Cigarettes    Quit date: 2010    Years since quitting: 10.6  . Smokeless tobacco: Never Used  Substance and Sexual Activity  . Alcohol use: Not on file  . Drug use: Not on file  . Sexual activity: Not on file

## 2018-12-03 DIAGNOSIS — F419 Anxiety disorder, unspecified: Secondary | ICD-10-CM | POA: Diagnosis not present

## 2018-12-03 DIAGNOSIS — G4700 Insomnia, unspecified: Secondary | ICD-10-CM | POA: Diagnosis not present

## 2018-12-03 DIAGNOSIS — Z23 Encounter for immunization: Secondary | ICD-10-CM | POA: Diagnosis not present

## 2018-12-03 DIAGNOSIS — E039 Hypothyroidism, unspecified: Secondary | ICD-10-CM | POA: Diagnosis not present

## 2018-12-03 DIAGNOSIS — E559 Vitamin D deficiency, unspecified: Secondary | ICD-10-CM | POA: Diagnosis not present

## 2018-12-03 DIAGNOSIS — M810 Age-related osteoporosis without current pathological fracture: Secondary | ICD-10-CM | POA: Diagnosis not present

## 2018-12-03 DIAGNOSIS — M545 Low back pain: Secondary | ICD-10-CM | POA: Diagnosis not present

## 2018-12-10 DIAGNOSIS — Z1231 Encounter for screening mammogram for malignant neoplasm of breast: Secondary | ICD-10-CM | POA: Diagnosis not present

## 2018-12-25 DIAGNOSIS — S61213A Laceration without foreign body of left middle finger without damage to nail, initial encounter: Secondary | ICD-10-CM | POA: Diagnosis not present

## 2019-01-03 IMAGING — DX DG CHEST 2V
2 series · 2 of 2 positions shown · non-contrast
Comparison: CT scan of the chest December 05, 2007 and chest x-ray
December 04, 2007.

CLINICAL DATA: Two weeks of cough and bronchitis symptoms with
onset of sharp chest pain and dizziness this morning. History of
asthma, former smoker.

EXAM:
CHEST  2 VIEW

[w chest pa]
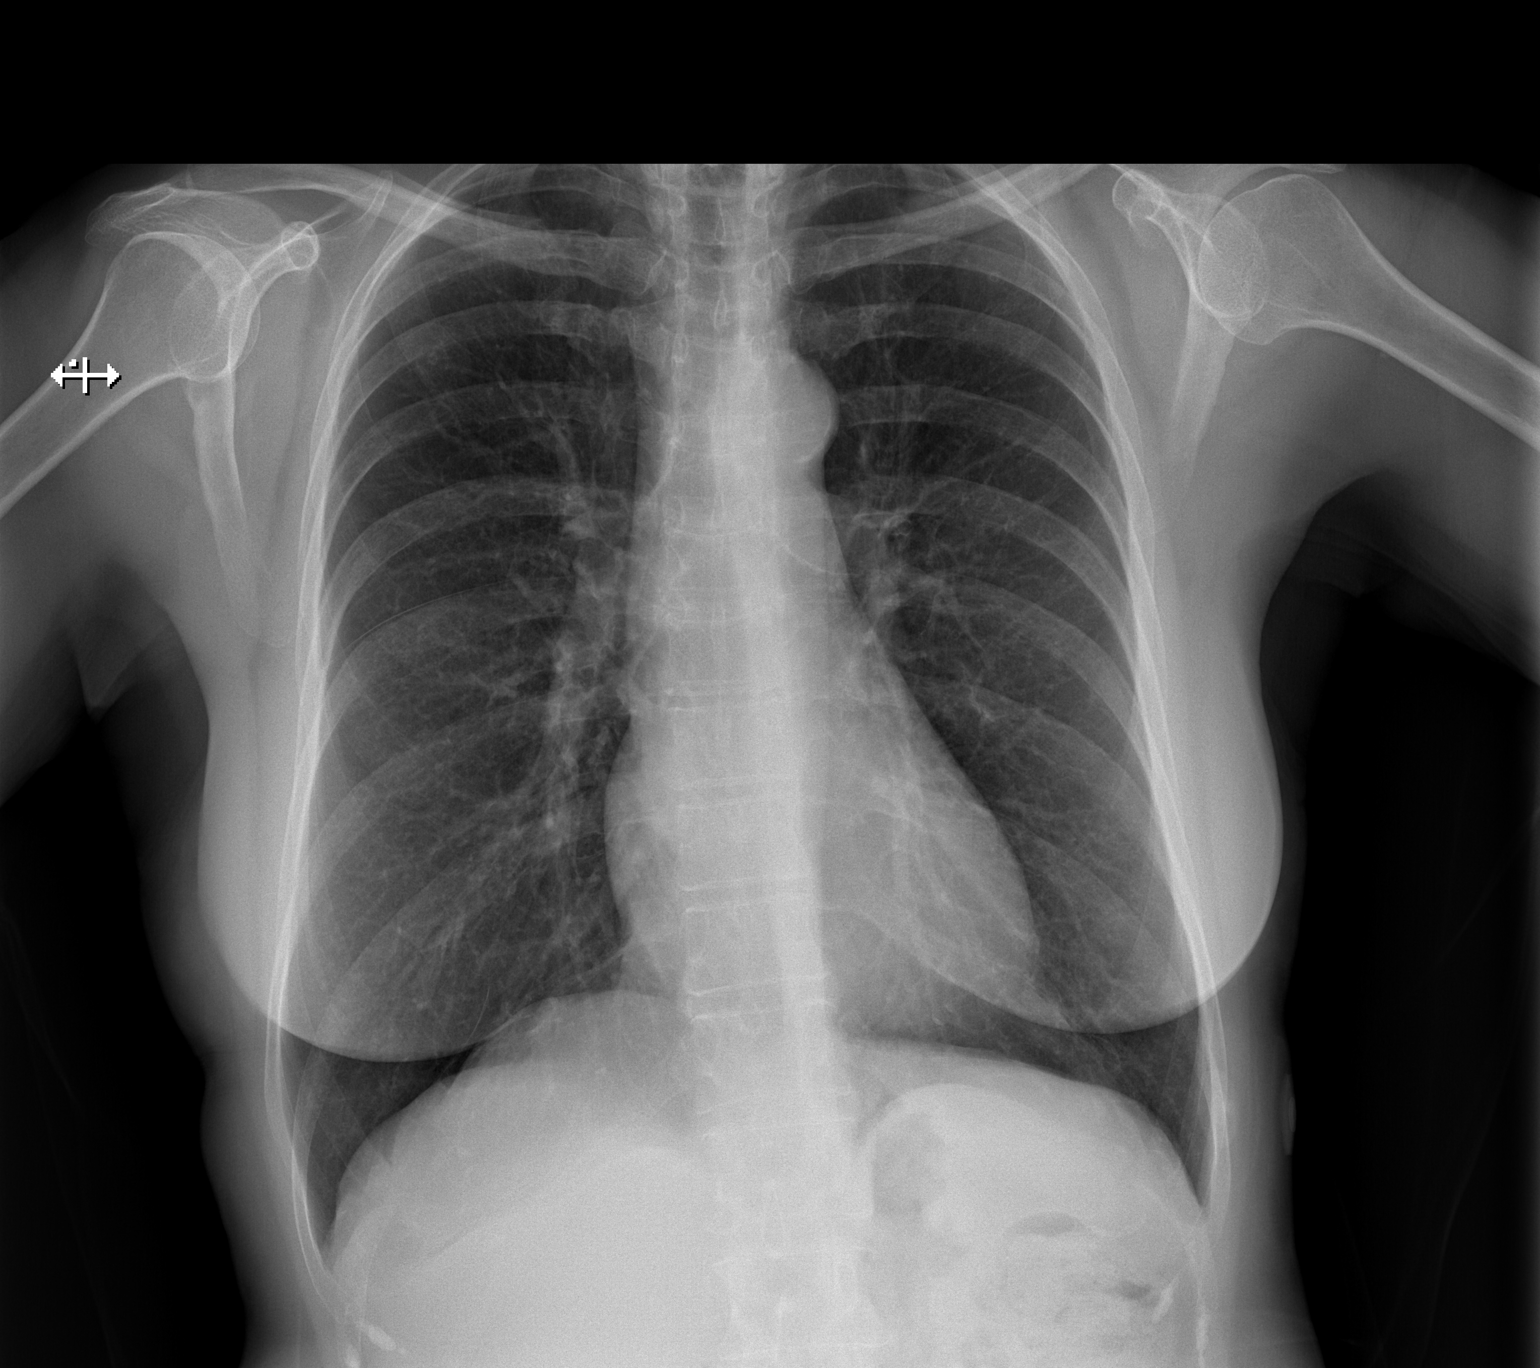

[w chest lat]
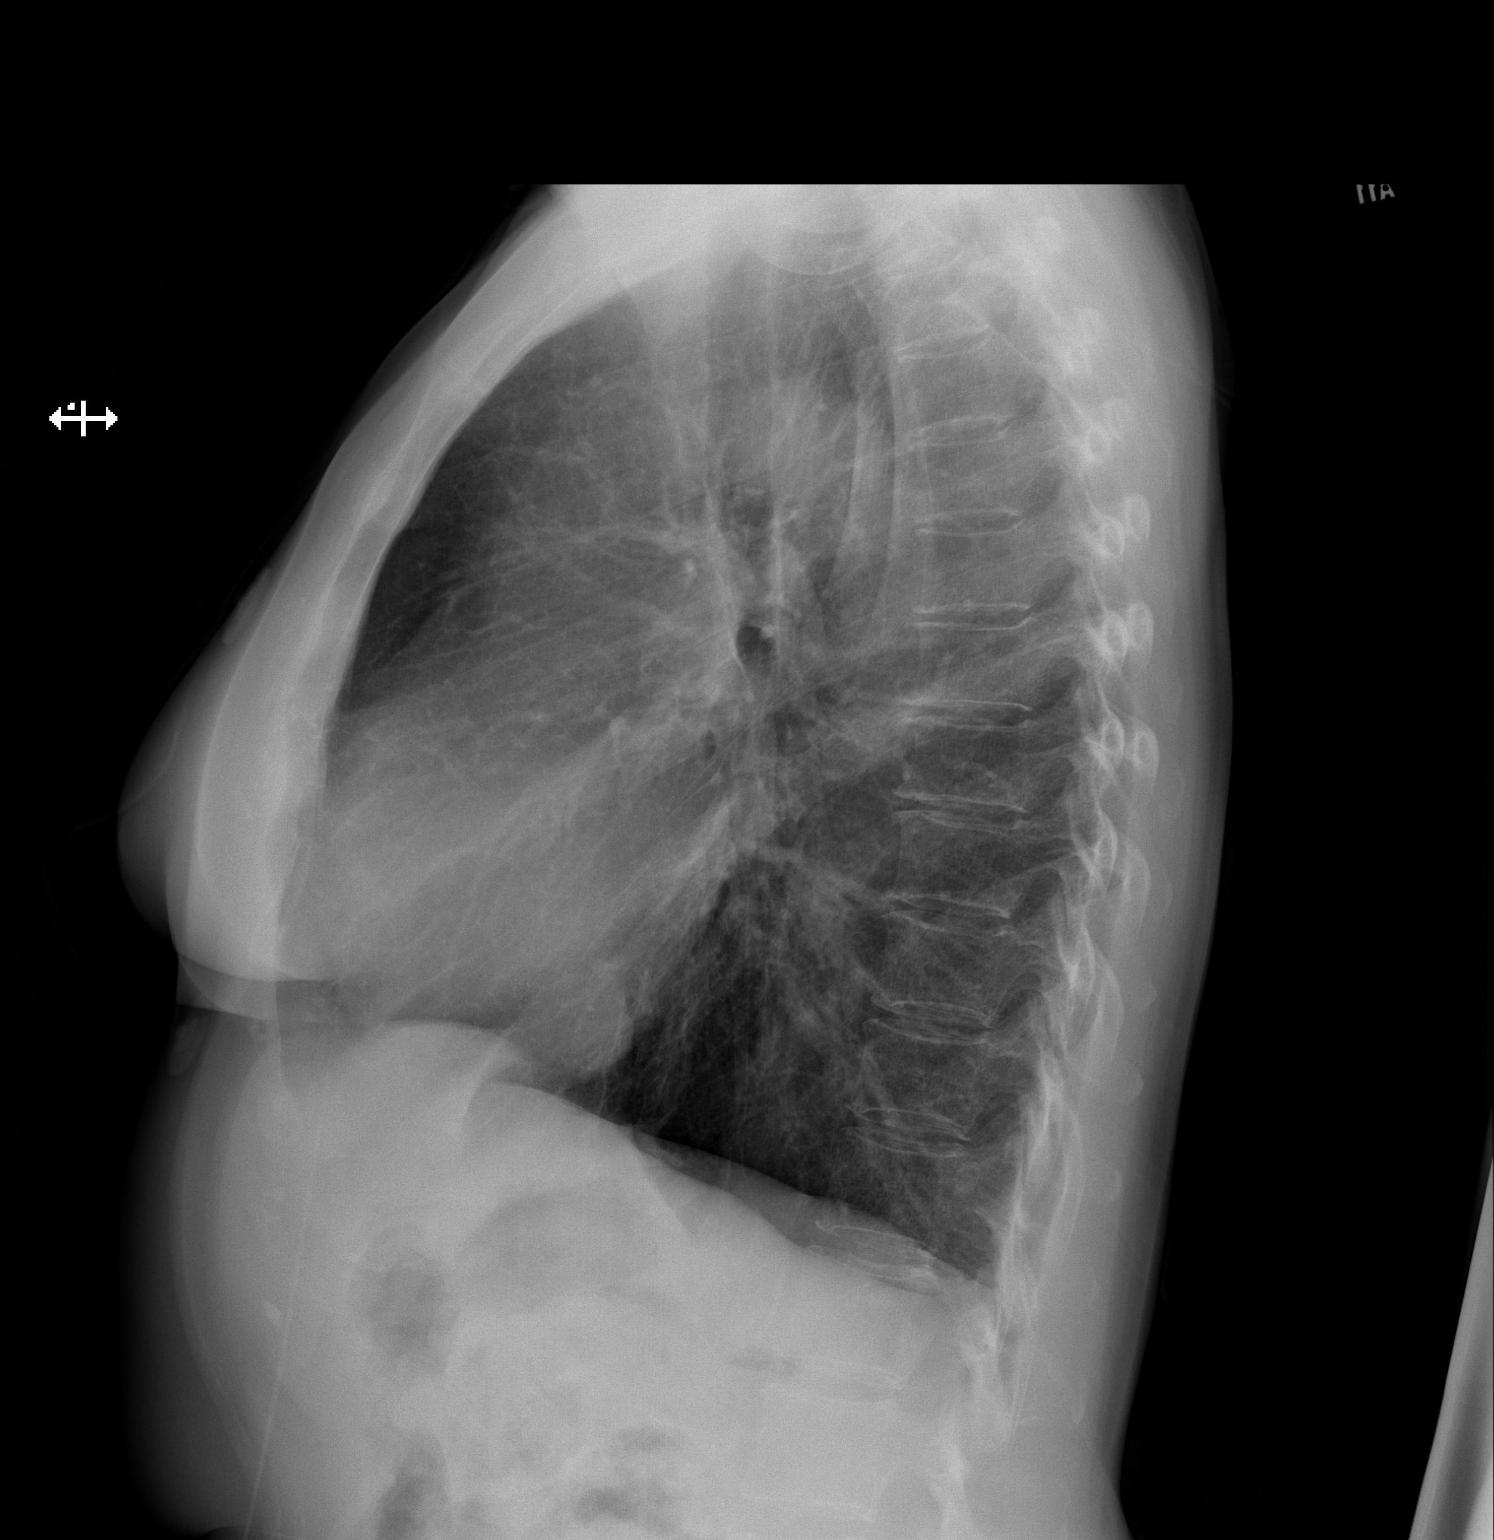

[2 of 2 positions shown; findings below may reference images not displayed]

FINDINGS: The lungs remain hyperinflated with hemidiaphragm flattening. There
is no focal infiltrate. There is no pleural effusion or
pneumothorax. The heart and pulmonary vascularity are normal. The
mediastinum is normal in width. There is calcification in the wall
of the aortic arch. The bony thorax exhibits no acute abnormality.
IMPRESSION: Asthma-COPD. No pneumonia, CHF, nor other acute cardiopulmonary
abnormality.

Thoracic aortic atherosclerosis.

## 2019-03-18 DIAGNOSIS — F419 Anxiety disorder, unspecified: Secondary | ICD-10-CM | POA: Diagnosis not present

## 2019-03-18 DIAGNOSIS — G4700 Insomnia, unspecified: Secondary | ICD-10-CM | POA: Diagnosis not present

## 2019-03-18 DIAGNOSIS — J309 Allergic rhinitis, unspecified: Secondary | ICD-10-CM | POA: Diagnosis not present

## 2019-03-18 DIAGNOSIS — E7800 Pure hypercholesterolemia, unspecified: Secondary | ICD-10-CM | POA: Diagnosis not present

## 2019-04-15 DIAGNOSIS — F419 Anxiety disorder, unspecified: Secondary | ICD-10-CM | POA: Diagnosis not present

## 2019-06-10 DIAGNOSIS — E7800 Pure hypercholesterolemia, unspecified: Secondary | ICD-10-CM | POA: Diagnosis not present

## 2019-06-10 DIAGNOSIS — E039 Hypothyroidism, unspecified: Secondary | ICD-10-CM | POA: Diagnosis not present

## 2019-06-10 DIAGNOSIS — F419 Anxiety disorder, unspecified: Secondary | ICD-10-CM | POA: Diagnosis not present

## 2019-06-10 DIAGNOSIS — M810 Age-related osteoporosis without current pathological fracture: Secondary | ICD-10-CM | POA: Diagnosis not present

## 2019-06-10 DIAGNOSIS — J4520 Mild intermittent asthma, uncomplicated: Secondary | ICD-10-CM | POA: Diagnosis not present

## 2019-06-10 DIAGNOSIS — Z0001 Encounter for general adult medical examination with abnormal findings: Secondary | ICD-10-CM | POA: Diagnosis not present

## 2019-06-10 DIAGNOSIS — E559 Vitamin D deficiency, unspecified: Secondary | ICD-10-CM | POA: Diagnosis not present

## 2019-06-26 DIAGNOSIS — M8589 Other specified disorders of bone density and structure, multiple sites: Secondary | ICD-10-CM | POA: Diagnosis not present

## 2019-07-28 DIAGNOSIS — H43812 Vitreous degeneration, left eye: Secondary | ICD-10-CM | POA: Diagnosis not present

## 2019-08-27 DIAGNOSIS — M810 Age-related osteoporosis without current pathological fracture: Secondary | ICD-10-CM | POA: Diagnosis not present

## 2019-08-27 DIAGNOSIS — E039 Hypothyroidism, unspecified: Secondary | ICD-10-CM | POA: Diagnosis not present

## 2019-09-17 DIAGNOSIS — H04123 Dry eye syndrome of bilateral lacrimal glands: Secondary | ICD-10-CM | POA: Diagnosis not present

## 2019-09-17 DIAGNOSIS — H31001 Unspecified chorioretinal scars, right eye: Secondary | ICD-10-CM | POA: Diagnosis not present

## 2019-09-17 DIAGNOSIS — H43812 Vitreous degeneration, left eye: Secondary | ICD-10-CM | POA: Diagnosis not present

## 2019-09-17 DIAGNOSIS — H5203 Hypermetropia, bilateral: Secondary | ICD-10-CM | POA: Diagnosis not present

## 2019-09-24 DIAGNOSIS — M810 Age-related osteoporosis without current pathological fracture: Secondary | ICD-10-CM | POA: Diagnosis not present

## 2019-12-17 DIAGNOSIS — Z1211 Encounter for screening for malignant neoplasm of colon: Secondary | ICD-10-CM | POA: Diagnosis not present

## 2019-12-17 DIAGNOSIS — J309 Allergic rhinitis, unspecified: Secondary | ICD-10-CM | POA: Diagnosis not present

## 2019-12-17 DIAGNOSIS — J4520 Mild intermittent asthma, uncomplicated: Secondary | ICD-10-CM | POA: Diagnosis not present

## 2019-12-17 DIAGNOSIS — F419 Anxiety disorder, unspecified: Secondary | ICD-10-CM | POA: Diagnosis not present

## 2019-12-17 DIAGNOSIS — R1312 Dysphagia, oropharyngeal phase: Secondary | ICD-10-CM | POA: Diagnosis not present

## 2020-01-16 DIAGNOSIS — Z23 Encounter for immunization: Secondary | ICD-10-CM | POA: Diagnosis not present

## 2020-01-24 ENCOUNTER — Ambulatory Visit: Payer: Medicare Other | Attending: Internal Medicine

## 2020-01-24 DIAGNOSIS — Z23 Encounter for immunization: Secondary | ICD-10-CM

## 2020-01-24 NOTE — Progress Notes (Signed)
   Covid-19 Vaccination [facility]  Name:  PAULLA MCCLASKEY    MRN: [id number] DOB: 1951-01-18  01/24/2020  Ms. Wandersee was observed post Covid-19 immunization for 15 minutes without incident. She was provided with Vaccine Information Sheet and instruction to access the V-Safe system.   Ms. Casimir was instructed to call 911 with any severe reactions post vaccine: Marland Kitchen Difficulty breathing  . Swelling of face and throat  . A fast heartbeat  . A bad rash all over body  . Dizziness and weakness

## 2020-02-11 DIAGNOSIS — Z86010 Personal history of colonic polyps: Secondary | ICD-10-CM | POA: Diagnosis not present

## 2020-02-11 DIAGNOSIS — R1319 Other dysphagia: Secondary | ICD-10-CM | POA: Diagnosis not present

## 2020-02-11 DIAGNOSIS — K5909 Other constipation: Secondary | ICD-10-CM | POA: Diagnosis not present

## 2020-02-11 DIAGNOSIS — K219 Gastro-esophageal reflux disease without esophagitis: Secondary | ICD-10-CM | POA: Diagnosis not present

## 2020-04-07 DIAGNOSIS — M810 Age-related osteoporosis without current pathological fracture: Secondary | ICD-10-CM | POA: Diagnosis not present

## 2020-04-14 DIAGNOSIS — Z1159 Encounter for screening for other viral diseases: Secondary | ICD-10-CM | POA: Diagnosis not present

## 2020-04-19 DIAGNOSIS — D124 Benign neoplasm of descending colon: Secondary | ICD-10-CM | POA: Diagnosis not present

## 2020-04-19 DIAGNOSIS — K5730 Diverticulosis of large intestine without perforation or abscess without bleeding: Secondary | ICD-10-CM | POA: Diagnosis not present

## 2020-04-19 DIAGNOSIS — K219 Gastro-esophageal reflux disease without esophagitis: Secondary | ICD-10-CM | POA: Diagnosis not present

## 2020-04-19 DIAGNOSIS — K3189 Other diseases of stomach and duodenum: Secondary | ICD-10-CM | POA: Diagnosis not present

## 2020-04-19 DIAGNOSIS — R12 Heartburn: Secondary | ICD-10-CM | POA: Diagnosis not present

## 2020-04-19 DIAGNOSIS — Z86010 Personal history of colonic polyps: Secondary | ICD-10-CM | POA: Diagnosis not present

## 2020-04-19 DIAGNOSIS — D123 Benign neoplasm of transverse colon: Secondary | ICD-10-CM | POA: Diagnosis not present

## 2020-04-19 DIAGNOSIS — K621 Rectal polyp: Secondary | ICD-10-CM | POA: Diagnosis not present

## 2020-04-19 DIAGNOSIS — D122 Benign neoplasm of ascending colon: Secondary | ICD-10-CM | POA: Diagnosis not present

## 2020-04-19 DIAGNOSIS — D125 Benign neoplasm of sigmoid colon: Secondary | ICD-10-CM | POA: Diagnosis not present

## 2020-04-19 DIAGNOSIS — K2930 Chronic superficial gastritis without bleeding: Secondary | ICD-10-CM | POA: Diagnosis not present

## 2020-04-19 DIAGNOSIS — K2100 Gastro-esophageal reflux disease with esophagitis, without bleeding: Secondary | ICD-10-CM | POA: Diagnosis not present

## 2020-04-19 DIAGNOSIS — D120 Benign neoplasm of cecum: Secondary | ICD-10-CM | POA: Diagnosis not present

## 2020-04-19 DIAGNOSIS — R1310 Dysphagia, unspecified: Secondary | ICD-10-CM | POA: Diagnosis not present

## 2020-04-22 DIAGNOSIS — K2930 Chronic superficial gastritis without bleeding: Secondary | ICD-10-CM | POA: Diagnosis not present

## 2020-04-22 DIAGNOSIS — D124 Benign neoplasm of descending colon: Secondary | ICD-10-CM | POA: Diagnosis not present

## 2020-04-22 DIAGNOSIS — K621 Rectal polyp: Secondary | ICD-10-CM | POA: Diagnosis not present

## 2020-04-22 DIAGNOSIS — K219 Gastro-esophageal reflux disease without esophagitis: Secondary | ICD-10-CM | POA: Diagnosis not present

## 2020-04-22 DIAGNOSIS — D125 Benign neoplasm of sigmoid colon: Secondary | ICD-10-CM | POA: Diagnosis not present

## 2020-04-22 DIAGNOSIS — D120 Benign neoplasm of cecum: Secondary | ICD-10-CM | POA: Diagnosis not present

## 2020-04-22 DIAGNOSIS — D123 Benign neoplasm of transverse colon: Secondary | ICD-10-CM | POA: Diagnosis not present

## 2020-06-16 DIAGNOSIS — E039 Hypothyroidism, unspecified: Secondary | ICD-10-CM | POA: Diagnosis not present

## 2020-06-16 DIAGNOSIS — J4520 Mild intermittent asthma, uncomplicated: Secondary | ICD-10-CM | POA: Diagnosis not present

## 2020-06-16 DIAGNOSIS — F419 Anxiety disorder, unspecified: Secondary | ICD-10-CM | POA: Diagnosis not present

## 2020-06-16 DIAGNOSIS — Z79899 Other long term (current) drug therapy: Secondary | ICD-10-CM | POA: Diagnosis not present

## 2020-06-16 DIAGNOSIS — G4700 Insomnia, unspecified: Secondary | ICD-10-CM | POA: Diagnosis not present

## 2020-06-16 DIAGNOSIS — M810 Age-related osteoporosis without current pathological fracture: Secondary | ICD-10-CM | POA: Diagnosis not present

## 2020-06-16 DIAGNOSIS — Z0001 Encounter for general adult medical examination with abnormal findings: Secondary | ICD-10-CM | POA: Diagnosis not present

## 2020-06-16 DIAGNOSIS — E7800 Pure hypercholesterolemia, unspecified: Secondary | ICD-10-CM | POA: Diagnosis not present

## 2020-06-16 DIAGNOSIS — E559 Vitamin D deficiency, unspecified: Secondary | ICD-10-CM | POA: Diagnosis not present

## 2020-08-26 DIAGNOSIS — M810 Age-related osteoporosis without current pathological fracture: Secondary | ICD-10-CM | POA: Diagnosis not present

## 2020-08-26 DIAGNOSIS — E039 Hypothyroidism, unspecified: Secondary | ICD-10-CM | POA: Diagnosis not present

## 2020-10-06 DIAGNOSIS — M810 Age-related osteoporosis without current pathological fracture: Secondary | ICD-10-CM | POA: Diagnosis not present

## 2020-10-20 DIAGNOSIS — E039 Hypothyroidism, unspecified: Secondary | ICD-10-CM | POA: Diagnosis not present

## 2020-12-09 DIAGNOSIS — F419 Anxiety disorder, unspecified: Secondary | ICD-10-CM | POA: Diagnosis not present

## 2020-12-09 DIAGNOSIS — E039 Hypothyroidism, unspecified: Secondary | ICD-10-CM | POA: Diagnosis not present

## 2020-12-09 DIAGNOSIS — G4700 Insomnia, unspecified: Secondary | ICD-10-CM | POA: Diagnosis not present

## 2020-12-09 DIAGNOSIS — E559 Vitamin D deficiency, unspecified: Secondary | ICD-10-CM | POA: Diagnosis not present

## 2020-12-09 DIAGNOSIS — J4540 Moderate persistent asthma, uncomplicated: Secondary | ICD-10-CM | POA: Diagnosis not present

## 2021-01-03 DIAGNOSIS — Z23 Encounter for immunization: Secondary | ICD-10-CM | POA: Diagnosis not present

## 2021-01-14 DIAGNOSIS — Z23 Encounter for immunization: Secondary | ICD-10-CM | POA: Diagnosis not present

## 2021-02-26 DIAGNOSIS — Z1231 Encounter for screening mammogram for malignant neoplasm of breast: Secondary | ICD-10-CM | POA: Diagnosis not present

## 2021-04-20 DIAGNOSIS — M810 Age-related osteoporosis without current pathological fracture: Secondary | ICD-10-CM | POA: Diagnosis not present

## 2021-05-11 DIAGNOSIS — K5730 Diverticulosis of large intestine without perforation or abscess without bleeding: Secondary | ICD-10-CM | POA: Diagnosis not present

## 2021-05-11 DIAGNOSIS — D124 Benign neoplasm of descending colon: Secondary | ICD-10-CM | POA: Diagnosis not present

## 2021-05-11 DIAGNOSIS — Z86010 Personal history of colonic polyps: Secondary | ICD-10-CM | POA: Diagnosis not present

## 2021-05-11 DIAGNOSIS — D122 Benign neoplasm of ascending colon: Secondary | ICD-10-CM | POA: Diagnosis not present

## 2021-05-11 DIAGNOSIS — D123 Benign neoplasm of transverse colon: Secondary | ICD-10-CM | POA: Diagnosis not present

## 2021-05-11 DIAGNOSIS — K648 Other hemorrhoids: Secondary | ICD-10-CM | POA: Diagnosis not present

## 2021-05-13 DIAGNOSIS — D123 Benign neoplasm of transverse colon: Secondary | ICD-10-CM | POA: Diagnosis not present

## 2021-05-13 DIAGNOSIS — D122 Benign neoplasm of ascending colon: Secondary | ICD-10-CM | POA: Diagnosis not present

## 2021-05-13 DIAGNOSIS — D124 Benign neoplasm of descending colon: Secondary | ICD-10-CM | POA: Diagnosis not present

## 2021-08-25 DIAGNOSIS — E039 Hypothyroidism, unspecified: Secondary | ICD-10-CM | POA: Diagnosis not present

## 2021-08-25 DIAGNOSIS — M810 Age-related osteoporosis without current pathological fracture: Secondary | ICD-10-CM | POA: Diagnosis not present

## 2021-09-06 DIAGNOSIS — Z79899 Other long term (current) drug therapy: Secondary | ICD-10-CM | POA: Diagnosis not present

## 2021-09-06 DIAGNOSIS — G4700 Insomnia, unspecified: Secondary | ICD-10-CM | POA: Diagnosis not present

## 2021-09-06 DIAGNOSIS — Z0000 Encounter for general adult medical examination without abnormal findings: Secondary | ICD-10-CM | POA: Diagnosis not present

## 2021-09-06 DIAGNOSIS — E039 Hypothyroidism, unspecified: Secondary | ICD-10-CM | POA: Diagnosis not present

## 2021-09-06 DIAGNOSIS — M810 Age-related osteoporosis without current pathological fracture: Secondary | ICD-10-CM | POA: Diagnosis not present

## 2021-10-26 DIAGNOSIS — M810 Age-related osteoporosis without current pathological fracture: Secondary | ICD-10-CM | POA: Diagnosis not present

## 2021-10-26 DIAGNOSIS — E039 Hypothyroidism, unspecified: Secondary | ICD-10-CM | POA: Diagnosis not present

## 2022-01-05 DIAGNOSIS — Z23 Encounter for immunization: Secondary | ICD-10-CM | POA: Diagnosis not present

## 2022-01-20 DIAGNOSIS — Z23 Encounter for immunization: Secondary | ICD-10-CM | POA: Diagnosis not present

## 2022-02-07 DIAGNOSIS — H52203 Unspecified astigmatism, bilateral: Secondary | ICD-10-CM | POA: Diagnosis not present

## 2022-02-07 DIAGNOSIS — H43813 Vitreous degeneration, bilateral: Secondary | ICD-10-CM | POA: Diagnosis not present

## 2022-02-07 DIAGNOSIS — H2513 Age-related nuclear cataract, bilateral: Secondary | ICD-10-CM | POA: Diagnosis not present

## 2022-02-07 DIAGNOSIS — H5203 Hypermetropia, bilateral: Secondary | ICD-10-CM | POA: Diagnosis not present

## 2022-02-07 DIAGNOSIS — H40013 Open angle with borderline findings, low risk, bilateral: Secondary | ICD-10-CM | POA: Diagnosis not present

## 2022-02-07 DIAGNOSIS — H524 Presbyopia: Secondary | ICD-10-CM | POA: Diagnosis not present

## 2022-04-13 DIAGNOSIS — Z1231 Encounter for screening mammogram for malignant neoplasm of breast: Secondary | ICD-10-CM | POA: Diagnosis not present

## 2022-05-10 DIAGNOSIS — M810 Age-related osteoporosis without current pathological fracture: Secondary | ICD-10-CM | POA: Diagnosis not present

## 2022-08-30 DIAGNOSIS — M810 Age-related osteoporosis without current pathological fracture: Secondary | ICD-10-CM | POA: Diagnosis not present

## 2022-08-30 DIAGNOSIS — E039 Hypothyroidism, unspecified: Secondary | ICD-10-CM | POA: Diagnosis not present

## 2022-09-12 DIAGNOSIS — F419 Anxiety disorder, unspecified: Secondary | ICD-10-CM | POA: Diagnosis not present

## 2022-09-12 DIAGNOSIS — E7800 Pure hypercholesterolemia, unspecified: Secondary | ICD-10-CM | POA: Diagnosis not present

## 2022-09-12 DIAGNOSIS — E559 Vitamin D deficiency, unspecified: Secondary | ICD-10-CM | POA: Diagnosis not present

## 2022-09-12 DIAGNOSIS — G4700 Insomnia, unspecified: Secondary | ICD-10-CM | POA: Diagnosis not present

## 2022-09-12 DIAGNOSIS — M810 Age-related osteoporosis without current pathological fracture: Secondary | ICD-10-CM | POA: Diagnosis not present

## 2022-09-12 DIAGNOSIS — Z0001 Encounter for general adult medical examination with abnormal findings: Secondary | ICD-10-CM | POA: Diagnosis not present

## 2022-09-12 DIAGNOSIS — F320 Major depressive disorder, single episode, mild: Secondary | ICD-10-CM | POA: Diagnosis not present

## 2022-09-12 DIAGNOSIS — Z79899 Other long term (current) drug therapy: Secondary | ICD-10-CM | POA: Diagnosis not present

## 2022-09-12 DIAGNOSIS — E039 Hypothyroidism, unspecified: Secondary | ICD-10-CM | POA: Diagnosis not present

## 2022-09-12 DIAGNOSIS — J4540 Moderate persistent asthma, uncomplicated: Secondary | ICD-10-CM | POA: Diagnosis not present

## 2023-01-04 DIAGNOSIS — Z23 Encounter for immunization: Secondary | ICD-10-CM | POA: Diagnosis not present

## 2023-02-14 DIAGNOSIS — H2513 Age-related nuclear cataract, bilateral: Secondary | ICD-10-CM | POA: Diagnosis not present

## 2023-02-14 DIAGNOSIS — H04123 Dry eye syndrome of bilateral lacrimal glands: Secondary | ICD-10-CM | POA: Diagnosis not present

## 2023-02-14 DIAGNOSIS — H25013 Cortical age-related cataract, bilateral: Secondary | ICD-10-CM | POA: Diagnosis not present

## 2023-02-14 DIAGNOSIS — H524 Presbyopia: Secondary | ICD-10-CM | POA: Diagnosis not present

## 2023-02-14 DIAGNOSIS — H52203 Unspecified astigmatism, bilateral: Secondary | ICD-10-CM | POA: Diagnosis not present

## 2023-02-14 DIAGNOSIS — H5203 Hypermetropia, bilateral: Secondary | ICD-10-CM | POA: Diagnosis not present

## 2023-02-14 DIAGNOSIS — H43813 Vitreous degeneration, bilateral: Secondary | ICD-10-CM | POA: Diagnosis not present

## 2023-02-27 DIAGNOSIS — M810 Age-related osteoporosis without current pathological fracture: Secondary | ICD-10-CM | POA: Diagnosis not present

## 2023-03-15 DIAGNOSIS — Z79899 Other long term (current) drug therapy: Secondary | ICD-10-CM | POA: Diagnosis not present

## 2023-03-15 DIAGNOSIS — R140 Abdominal distension (gaseous): Secondary | ICD-10-CM | POA: Diagnosis not present

## 2023-03-15 DIAGNOSIS — G4700 Insomnia, unspecified: Secondary | ICD-10-CM | POA: Diagnosis not present

## 2023-03-15 DIAGNOSIS — F419 Anxiety disorder, unspecified: Secondary | ICD-10-CM | POA: Diagnosis not present

## 2023-04-05 DIAGNOSIS — R3129 Other microscopic hematuria: Secondary | ICD-10-CM | POA: Diagnosis not present

## 2023-04-19 DIAGNOSIS — Z1231 Encounter for screening mammogram for malignant neoplasm of breast: Secondary | ICD-10-CM | POA: Diagnosis not present

## 2023-08-31 DIAGNOSIS — E039 Hypothyroidism, unspecified: Secondary | ICD-10-CM | POA: Diagnosis not present

## 2023-08-31 DIAGNOSIS — M810 Age-related osteoporosis without current pathological fracture: Secondary | ICD-10-CM | POA: Diagnosis not present

## 2023-09-19 DIAGNOSIS — E039 Hypothyroidism, unspecified: Secondary | ICD-10-CM | POA: Diagnosis not present

## 2023-09-19 DIAGNOSIS — Z79899 Other long term (current) drug therapy: Secondary | ICD-10-CM | POA: Diagnosis not present

## 2023-09-19 DIAGNOSIS — Z0000 Encounter for general adult medical examination without abnormal findings: Secondary | ICD-10-CM | POA: Diagnosis not present

## 2023-09-19 DIAGNOSIS — G4700 Insomnia, unspecified: Secondary | ICD-10-CM | POA: Diagnosis not present

## 2023-09-19 DIAGNOSIS — M810 Age-related osteoporosis without current pathological fracture: Secondary | ICD-10-CM | POA: Diagnosis not present

## 2023-09-19 DIAGNOSIS — J4520 Mild intermittent asthma, uncomplicated: Secondary | ICD-10-CM | POA: Diagnosis not present

## 2023-09-19 DIAGNOSIS — F419 Anxiety disorder, unspecified: Secondary | ICD-10-CM | POA: Diagnosis not present

## 2023-11-28 DIAGNOSIS — M8589 Other specified disorders of bone density and structure, multiple sites: Secondary | ICD-10-CM | POA: Diagnosis not present

## 2023-12-27 DIAGNOSIS — M810 Age-related osteoporosis without current pathological fracture: Secondary | ICD-10-CM | POA: Diagnosis not present

## 2024-01-21 ENCOUNTER — Other Ambulatory Visit: Payer: Self-pay

## 2024-01-21 ENCOUNTER — Ambulatory Visit (HOSPITAL_COMMUNITY)

## 2024-01-21 ENCOUNTER — Encounter: Payer: Self-pay | Admitting: Podiatry

## 2024-01-21 ENCOUNTER — Ambulatory Visit: Admitting: Podiatry

## 2024-01-21 ENCOUNTER — Ambulatory Visit (HOSPITAL_COMMUNITY)
Admission: EM | Admit: 2024-01-21 | Discharge: 2024-01-21 | Disposition: A | Discharge status: Home / Self Care | Attending: Physician Assistant | Admitting: Physician Assistant

## 2024-01-21 ENCOUNTER — Ambulatory Visit (INDEPENDENT_AMBULATORY_CARE_PROVIDER_SITE_OTHER)

## 2024-01-21 VITALS — Ht 62.0 in | Wt 115.2 lb

## 2024-01-21 DIAGNOSIS — M79672 Pain in left foot: Secondary | ICD-10-CM | POA: Diagnosis not present

## 2024-01-21 DIAGNOSIS — W19XXXA Unspecified fall, initial encounter: Secondary | ICD-10-CM

## 2024-01-21 DIAGNOSIS — S92302A Fracture of unspecified metatarsal bone(s), left foot, initial encounter for closed fracture: Secondary | ICD-10-CM | POA: Diagnosis not present

## 2024-01-21 DIAGNOSIS — M25572 Pain in left ankle and joints of left foot: Secondary | ICD-10-CM

## 2024-01-21 DIAGNOSIS — S92335A Nondisplaced fracture of third metatarsal bone, left foot, initial encounter for closed fracture: Secondary | ICD-10-CM | POA: Diagnosis not present

## 2024-01-21 DIAGNOSIS — M7989 Other specified soft tissue disorders: Secondary | ICD-10-CM | POA: Diagnosis not present

## 2024-01-21 MED ORDER — HYDROCODONE-ACETAMINOPHEN 5-325 MG PO TABS: 1.0000 | ORAL_TABLET | Freq: Two times a day (BID) | ORAL | 0 refills | Status: AC | PRN

## 2024-01-21 NOTE — ED Provider Notes (Signed)
 [facility]    CSN: [id number] Arrival date & time: 01/21/24  1236      History   Chief Complaint Chief Complaint  Patient presents with   Foot Injury    HPI Danielle Rivas is a 73 y.o. female.   Patient presents today for evaluation of left ankle and foot pain.  Reports that she was using a leaf blower on her back steps when she slipped and fell down a few stairs.  She did not hit her head and denies any associated loss of consciousness, headache, dizziness, nausea, vomiting, amnesia surrounding event.  She does not take any blood thinning medication.  She reports that she was initially able to bear weight and get back into the house after ascending over 9 stairs but over the next few hours she developed increasing swelling and discomfort.  Currently pain is rated 5 on a 0-10 pain scale, described as tightness, worse with attempted ambulation, no alleviating factors identified.  Denies previous injury or surgery involving her left ankle or foot.  Denies any numbness or paresthesias.  She has not had any over-the-counter medication for symptom management.  She does have a history of vitamin D deficiency and osteoporosis.    Past Medical History:  Diagnosis Date   Anxiety    Gastric ulcer    Hx of colonic polyps    Hypothyroid    Mild asthma    Osteoporosis    Panic attack    disorder   Pelvic fracture (HCC)    Vertebral fracture    Vitamin D deficiency     Patient Active Problem List   Diagnosis Date Noted   HYPOTHYROIDISM 03/30/2008   VITAMIN D DEFICIENCY 03/30/2008   PANIC DISORDER 03/30/2008   ALLERGIC RHINITIS 03/30/2008   OSTEOPOROSIS 03/30/2008   COUGH 03/30/2008    Past Surgical History:  Procedure Laterality Date   BILATERAL SALPINGOOPHORECTOMY     COLONOSCOPY     TONSILLECTOMY     TOTAL ABDOMINAL HYSTERECTOMY      OB History   No obstetric history on file.      Home Medications    Prior to Admission medications   Medication  Sig Start Date End Date Taking? Authorizing Provider  albuterol  (PROVENTIL  HFA;VENTOLIN  HFA) 108 (90 BASE) MCG/ACT inhaler Inhale 2 puffs into the lungs every 6 (six) hours as needed for wheezing or shortness of breath.   Yes [provider]  Calcium 1500 MG tablet Take 1,500 mg by mouth daily.   Yes [provider]  Cholecalciferol (VITAMIN D3) 2000 UNITS capsule Take 2,000 Units by mouth daily.   Yes [provider]  clonazePAM (KLONOPIN) 0.5 MG tablet Take 0.5 mg by mouth at bedtime.   Yes [provider]  fluticasone (FLONASE) 50 MCG/ACT nasal spray Place 2 sprays into both nostrils daily.   Yes [provider]  Fluticasone-Salmeterol (ADVAIR) 250-50 MCG/DOSE AEPB Inhale 1 puff into the lungs 2 (two) times daily.   Yes [provider]  HYDROcodone -acetaminophen  (NORCO/VICODIN) 5-325 MG tablet Take 1 tablet by mouth 2 (two) times daily as needed for up to 3 days. 01/21/24 01/24/24 Yes Trea Carnegie K, PA-C  levothyroxine (SYNTHROID, LEVOTHROID) 112 MCG tablet Take 112 mcg by mouth daily. 02/22/17  Yes [provider]  traZODone (DESYREL) 100 MG tablet Take 100 mg by mouth at bedtime.   Yes [provider]  denosumab  (PROLIA ) 60 MG/ML SOLN injection Inject 60 mg into the skin every 6 (six) months. Administer in  upper arm, thigh, or abdomen    [provider]  ipratropium (ATROVENT) 0.06 % nasal spray Place 2 sprays into both nostrils 4 (four) times daily. 02/25/17   [provider]  Vitamin D, Ergocalciferol, (DRISDOL) 1.25 MG (50000 UT) CAPS capsule TK 1 C PO EVERY OTHER WEEK 11/13/18   [provider]    Family History Family History  Problem Relation Age of Onset   Stroke Mother    Pneumonia Father    Bladder Cancer Father     Social History Social History   Tobacco Use   Smoking status: Former    Current packs/day: 0.00    Types: Cigarettes    Quit date: 2010    Years since quitting:  15.7   Smokeless tobacco: Never     Allergies   Doxycycline, Fosamax [alendronate sodium], and Levaquin [levofloxacin in d5w]   Review of Systems Review of Systems  Constitutional:  Positive for activity change. Negative for appetite change, fatigue and fever.  Eyes:  Negative for visual disturbance.  Musculoskeletal:  Positive for arthralgias, gait problem and joint swelling. Negative for myalgias.  Skin:  Negative for color change and wound.  Neurological:  Negative for dizziness, weakness, light-headedness, numbness and headaches.     Physical Exam Triage Vital Signs ED Triage Vitals  Encounter Vitals Group     BP 01/21/24 1420 119/69     Girls Systolic BP Percentile --      Girls Diastolic BP Percentile --      Boys Systolic BP Percentile --      Boys Diastolic BP Percentile --      Pulse Rate 01/21/24 1420 62     Resp 01/21/24 1420 20     Temp 01/21/24 1420 97.9 F (36.6 C)     Temp src --      SpO2 01/21/24 1420 97 %     Weight --      Height --      Head Circumference --      Peak Flow --      Pain Score 01/21/24 1416 5     Pain Loc --      Pain Education --      Exclude from Growth Chart --    No data found.  Updated Vital Signs BP 119/69   Pulse 62   Temp 97.9 F (36.6 C)   Resp 20   SpO2 97%   Visual Acuity Right Eye Distance:   Left Eye Distance:   Bilateral Distance:    Right Eye Near:   Left Eye Near:    Bilateral Near:     Physical Exam Vitals reviewed.  Constitutional:      General: She is awake. She is not in acute distress.    Appearance: Normal appearance. She is well-developed. She is not ill-appearing.     Comments: Very pleasant female appears stated age in no acute distress sitting comfortable in exam room in a wheelchair  HENT:     Head: Normocephalic and atraumatic.  Cardiovascular:     Rate and Rhythm: Normal rate and regular rhythm.     Pulses:          Posterior tibial pulses are 2+ on the left side.     Heart  sounds: Normal heart sounds, S1 normal and S2 normal. No murmur heard.    Comments: Capillary refill within 2 seconds to left toes Pulmonary:     Effort: Pulmonary effort is normal.  Breath sounds: Normal breath sounds. No wheezing, rhonchi or rales.     Comments: Clear to auscultation bilaterally Musculoskeletal:     Left ankle: No swelling. Tenderness present over the medial malleolus. No lateral malleolus tenderness. Normal range of motion. Anterior drawer test negative.     Left foot: Decreased range of motion. Normal capillary refill. Tenderness present. No swelling or bony tenderness.     Comments: Left ankle/foot: Tenderness palpation over medial malleolus and along dorsal foot with associated swelling and bruising.  She is significantly tender to palpation so difficult to palpate metatarsals and determine if there is a deformity.  Foot is neurovascularly intact.  Psychiatric:        Behavior: Behavior is cooperative.      UC Treatments / Results  Labs (all labs ordered are listed, but only abnormal results are displayed) Labs Reviewed - No data to display  EKG   Radiology DG Ankle Complete Left Result Date: 01/21/2024 EXAM: 3 OR MORE VIEW(S) XRAY OF THE LEFT ANKLE 01/21/2024 02:45:47 PM CLINICAL HISTORY: fall. Table formatting from the original note was not included.; PT reports she slipped on wet leaves on stairs today at 11AM. PT can not bear weight on LT foot. PT also has swelling to foot. COMPARISON: None available. FINDINGS: BONES AND JOINTS: Suspected nondisplaced fracture of the base of the 3rd metatarsal. No focal osseous lesion. No joint dislocation. SOFT TISSUES: The soft tissues are unremarkable. IMPRESSION: 1. Suspected nondisplaced fracture at the base of the third metatarsal. Electronically signed by: Norman Gatlin MD 01/21/2024 03:28 PM EDT RP Workstation: [id number]   DG Foot Complete Left Result Date: 01/21/2024 EXAM: 3 OR MORE VIEW(S) XRAY OF THE LEFT FOOT  01/21/2024 02:45:47 PM COMPARISON: None available. CLINICAL HISTORY: Fall with pain. PT reports she slipped on wet leaves on stairs today at 11AM. PT can not bear weight on LT foot. PT also has swelling to foot. FINDINGS: BONES AND JOINTS: Curvilinear lucencies in the proximal 3rd metatarsal metadiaphysis suspicious for nondisplaced fractures. No focal osseous lesion. No joint dislocation. SOFT TISSUES: The soft tissues are unremarkable. IMPRESSION: 1. Comminuted nondisplaced fracture of the proximal 3rd metatarsal. Consider CT for further evaluation. Electronically signed by: Norman Gatlin MD 01/21/2024 03:27 PM EDT RP Workstation: [id number]    Procedures Procedures (including critical care time)  Medications Ordered in UC Medications - No data to display  Initial Impression / Assessment and Plan / UC Course  I have reviewed the triage vital signs and the nursing notes.  Pertinent labs & imaging results that were available during my care of the patient were reviewed by me and considered in my medical decision making (see chart for details).     Patient is well-appearing, afebrile, nontoxic, nontachycardic.  Her foot is neurovascularly intact.  X-ray of foot and ankle were obtained that showed proximal third metatarsal comminuted fracture without displacement.  She was given a cam boot as well as a walker and encouraged to limit weightbearing is much as possible.  Because of the comminuted nature of fracture she requires podiatry follow-up and so I contacted [facility] and was able to schedule her an appointment for today (01/21/2024) at 5 PM.  Patient was informed of this appointment and will attend as scheduled.  She was given hydrocodone  to help with pain and we discussed that this medication is both addictive and sedating so she should limit use is much as possible.  She does take low-dose clonazepam at bedtime and we discussed that  she should not combine this medication as it  increases the risk of overdose.  Review of [place]  controlled substance database shows no inappropriate refills.  We discussed that if at any point she has significant worsening of symptoms including unbearable pain, numbness or paresthesias in her foot, discoloration she should go to the ER.  All questions answered patient satisfaction.  Final Clinical Impressions(s) / UC Diagnoses   Final diagnoses:  Acute left ankle pain  Left foot pain  Fall, initial encounter  Closed nondisplaced fracture of third metatarsal bone of left foot, initial encounter     Discharge Instructions      You have an appointment at 5:00PM today (01/21/2024) at [facility].  Please go there as we discussed.  Try to avoid putting weight on your foot and use a walker.  I have called in hydrocodone  for pain relief.  This can make you sleepy so do not drive or drink alcohol with taking it.  Tried to take it away from the time that you take your clonazepam.  It is addictive and sedating so you should try to limit use is much as possible.  We cannot provide any refills of this medicine.  If anything worsens you have increasing pain, numbness or tingling in your foot, difficulty moving your ankle or foot you need to go to the ER.     ED Prescriptions     Medication Sig Dispense Auth. Provider   HYDROcodone -acetaminophen  (NORCO/VICODIN) 5-325 MG tablet Take 1 tablet by mouth 2 (two) times daily as needed for up to 3 days. 6 tablet Jakaya Jacobowitz K, PA-C      I have reviewed the PDMP during this encounter.   Sherrell Rocky POUR, PA-C 01/21/24 1548

## 2024-01-21 NOTE — ED Triage Notes (Signed)
 PT reports she slipped on wet leaves on stairs today at 11AM. PT can not bear weight on LT foot. PT also has swelling to foot.

## 2024-01-21 NOTE — Discharge Instructions (Signed)
 You have an appointment at 5:00PM today (01/21/2024) at [facility].  Please go there as we discussed.  Try to avoid putting weight on your foot and use a walker.  I have called in hydrocodone  for pain relief.  This can make you sleepy so do not drive or drink alcohol with taking it.  Tried to take it away from the time that you take your clonazepam.  It is addictive and sedating so you should try to limit use is much as possible.  We cannot provide any refills of this medicine.  If anything worsens you have increasing pain, numbness or tingling in your foot, difficulty moving your ankle or foot you need to go to the ER.

## 2024-01-23 NOTE — Progress Notes (Signed)
   Chief Complaint  Patient presents with   Foot Injury    Pt is here due to left foot injury states she feel down stairs and hurt her foot was seen at urgent care today had x-rays done was told that she has a closed nondisplaced fracture to the 3rd metatarsal bone.    HPI: 73 y.o. female presenting today as a new patient referral from urgent care for left foot injury.  DOI: 01/21/2024  Past Medical History:  Diagnosis Date   Anxiety    Gastric ulcer    Hx of colonic polyps    Hypothyroid    Mild asthma    Osteoporosis    Panic attack    disorder   Pelvic fracture (HCC)    Vertebral fracture    Vitamin D deficiency     Past Surgical History:  Procedure Laterality Date   BILATERAL SALPINGOOPHORECTOMY     COLONOSCOPY     TONSILLECTOMY     TOTAL ABDOMINAL HYSTERECTOMY      Allergies  Allergen Reactions   Doxycycline Other (See Comments)    Stomach pain, reflux, heart burn   Fosamax [Alendronate Sodium] Other (See Comments)    Stomach ulcer   Levaquin [Levofloxacin In D5w] Swelling, Rash and Other (See Comments)    Swollen throat, rash, wheeze     Physical Exam: General: The patient is alert and oriented x3 in no acute distress.  Dermatology: Skin is warm, dry and supple bilateral lower extremities.   Vascular: Palpable pedal pulses bilaterally. Capillary refill within normal limits.  No appreciable edema.  No erythema.  Neurological: Grossly intact via light touch  Musculoskeletal Exam: No pedal deformities noted.  Overall gross alignment of the foot.  Ecchymosis with edema and tenderness noted throughout the dorsum of the midfoot  DG Ankle Complete Left 01/21/2024 IMPRESSION: 1. Suspected nondisplaced fracture at the base of the third metatarsal.  DG Foot Complete Left 01/21/2024 IMPRESSION: 1. Comminuted nondisplaced fracture of the proximal 3rd metatarsal. Consider CT for further evaluation.  Assessment/Plan of Care: 1.  Nondisplaced fracture proximal  aspect of the third metatarsal left.  DOI: 01/21/2024  -Patient evaluated.  X-rays reviewed -Continue NWB in the cam boot for an additional 4 to 6 weeks -RICE -Return to clinic 6 weeks follow-up x-ray       Thresa EMERSON Sar, DPM [facility]  Dr. Thresa EMERSON Sar, DPM    [address] [place], [place] [address]                Office [phone]  Fax [phone]

## 2024-01-30 DIAGNOSIS — Z23 Encounter for immunization: Secondary | ICD-10-CM | POA: Diagnosis not present

## 2024-02-20 DIAGNOSIS — H43813 Vitreous degeneration, bilateral: Secondary | ICD-10-CM | POA: Diagnosis not present

## 2024-02-20 DIAGNOSIS — H25013 Cortical age-related cataract, bilateral: Secondary | ICD-10-CM | POA: Diagnosis not present

## 2024-02-20 DIAGNOSIS — H52203 Unspecified astigmatism, bilateral: Secondary | ICD-10-CM | POA: Diagnosis not present

## 2024-02-20 DIAGNOSIS — H40013 Open angle with borderline findings, low risk, bilateral: Secondary | ICD-10-CM | POA: Diagnosis not present

## 2024-02-20 DIAGNOSIS — H5203 Hypermetropia, bilateral: Secondary | ICD-10-CM | POA: Diagnosis not present

## 2024-02-20 DIAGNOSIS — H2513 Age-related nuclear cataract, bilateral: Secondary | ICD-10-CM | POA: Diagnosis not present

## 2024-02-20 DIAGNOSIS — H04123 Dry eye syndrome of bilateral lacrimal glands: Secondary | ICD-10-CM | POA: Diagnosis not present
